# Patient Record
Sex: Male | Born: 1970 | Race: White | Hispanic: No | Marital: Married | Smoking: Former smoker
Health system: Southern US, Community
[De-identification: ages and names within clinical notes are randomized; demographics above are authoritative.]

## PROBLEM LIST (undated history)

## (undated) DIAGNOSIS — R634 Abnormal weight loss: Secondary | ICD-10-CM

## (undated) DIAGNOSIS — R1032 Left lower quadrant pain: Secondary | ICD-10-CM

## (undated) DIAGNOSIS — R011 Cardiac murmur, unspecified: Secondary | ICD-10-CM

## (undated) DIAGNOSIS — M5410 Radiculopathy, site unspecified: Secondary | ICD-10-CM

## (undated) DIAGNOSIS — F410 Panic disorder [episodic paroxysmal anxiety] without agoraphobia: Secondary | ICD-10-CM

## (undated) DIAGNOSIS — Z800 Family history of malignant neoplasm of digestive organs: Secondary | ICD-10-CM

## (undated) DIAGNOSIS — Z87438 Personal history of other diseases of male genital organs: Secondary | ICD-10-CM

## (undated) DIAGNOSIS — F32A Depression, unspecified: Secondary | ICD-10-CM

## (undated) DIAGNOSIS — F329 Major depressive disorder, single episode, unspecified: Secondary | ICD-10-CM

## (undated) DIAGNOSIS — G5620 Lesion of ulnar nerve, unspecified upper limb: Secondary | ICD-10-CM

## (undated) DIAGNOSIS — M720 Palmar fascial fibromatosis [Dupuytren]: Secondary | ICD-10-CM

## (undated) DIAGNOSIS — K219 Gastro-esophageal reflux disease without esophagitis: Secondary | ICD-10-CM

## (undated) DIAGNOSIS — J302 Other seasonal allergic rhinitis: Secondary | ICD-10-CM

## (undated) DIAGNOSIS — Z803 Family history of malignant neoplasm of breast: Secondary | ICD-10-CM

## (undated) DIAGNOSIS — J3489 Other specified disorders of nose and nasal sinuses: Secondary | ICD-10-CM

## (undated) HISTORY — DX: Family history of malignant neoplasm of breast: Z80.3

## (undated) HISTORY — DX: Depression, unspecified: F32.A

## (undated) HISTORY — DX: Radiculopathy, site unspecified: M54.10

## (undated) HISTORY — DX: Family history of malignant neoplasm of digestive organs: Z80.0

## (undated) HISTORY — DX: Left lower quadrant pain: R10.32

## (undated) HISTORY — DX: Lesion of ulnar nerve, unspecified upper limb: G56.20

## (undated) HISTORY — DX: Gastro-esophageal reflux disease without esophagitis: K21.9

## (undated) HISTORY — DX: Major depressive disorder, single episode, unspecified: F32.9

## (undated) HISTORY — DX: Personal history of other diseases of male genital organs: Z87.438

## (undated) HISTORY — DX: Other seasonal allergic rhinitis: J30.2

## (undated) HISTORY — DX: Cardiac murmur, unspecified: R01.1

## (undated) HISTORY — DX: Palmar fascial fibromatosis (dupuytren): M72.0

## (undated) HISTORY — PX: INGUINAL HERNIA REPAIR: SUR1180

## (undated) HISTORY — DX: Panic disorder (episodic paroxysmal anxiety): F41.0

## (undated) HISTORY — DX: Abnormal weight loss: R63.4

## (undated) HISTORY — DX: Other specified disorders of nose and nasal sinuses: J34.89

---

## 2009-11-24 ENCOUNTER — Ambulatory Visit: Payer: Self-pay | Admitting: Family Medicine

## 2009-11-24 DIAGNOSIS — K219 Gastro-esophageal reflux disease without esophagitis: Secondary | ICD-10-CM | POA: Insufficient documentation

## 2009-11-24 DIAGNOSIS — M79609 Pain in unspecified limb: Secondary | ICD-10-CM | POA: Insufficient documentation

## 2010-01-17 ENCOUNTER — Ambulatory Visit: Payer: Self-pay | Admitting: Family Medicine

## 2010-01-17 DIAGNOSIS — J309 Allergic rhinitis, unspecified: Secondary | ICD-10-CM

## 2010-03-13 DIAGNOSIS — R1032 Left lower quadrant pain: Secondary | ICD-10-CM

## 2010-03-13 HISTORY — DX: Left lower quadrant pain: R10.32

## 2010-04-12 NOTE — Assessment & Plan Note (Signed)
Summary: NEW TO EST//CCM   Vital Signs:  Patient profile:   40 year old male Weight:      187 pounds O2 Sat:      99 % Temp:     98.5 degrees F Pulse rate:   82 / minute BP sitting:   120 / 82  (left arm)  Vitals Entered By: Pura Spice, RN (November 24, 2009 2:47 PM) CC: to est discuss aches and pains  Is Patient Diabetic? No   History of Present Illness: 40 yr old male to establish with [place] to ask about symptoms in his left arm which have bothered him almost daily for about one year. He moved with his family to [place] about 2 months ago from the [place] for a work related assignment. He may or may not stay here long term. For the past year he has had sharp pains that begin in the left shoulder area that radiate down the left arm to the hand. These wax and wane. Sometimes he feels numbness or tingling down the arm as well. Often he wakes up at night with the arm either asleep or in pain. No trauma hx. No SOB or chest pains. No neck or back pains.   Preventive Screening-Counseling & Management  Alcohol-Tobacco     Smoking Status: quit  Allergies (verified): No Known Drug Allergies  Past History:  Past Medical History: chickenpox heart murmur GERD panic attacks in the past  Past Surgical History: left inguinal hernia repair  Family History: Reviewed history and no changes required. Family History Breast cancer 1st degree relative <50 Family History of Colon CA 1st degree relative <60  Social History: Reviewed history and no changes required. Married Former Smoker Alcohol use-yes Smoking Status:  quit  Review of Systems  The patient denies anorexia, fever, weight loss, weight gain, vision loss, decreased hearing, hoarseness, chest pain, syncope, dyspnea on exertion, peripheral edema, prolonged cough, headaches, hemoptysis, abdominal pain, melena, hematochezia, severe indigestion/heartburn, hematuria, incontinence, genital sores, muscle weakness,  suspicious skin lesions, transient blindness, difficulty walking, depression, unusual weight change, abnormal bleeding, enlarged lymph nodes, angioedema, breast masses, and testicular masses.    Physical Exam  General:  Well-developed,well-nourished,in no acute distress; alert,appropriate and cooperative throughout examination Neck:  No deformities, masses, or tenderness noted. Lungs:  Normal respiratory effort, chest expands symmetrically. Lungs are clear to auscultation, no crackles or wheezes. Heart:  Normal rate and regular rhythm. S1 and S2 normal without gallop, murmur, click, rub or other extra sounds. Msk:  No deformity or scoliosis noted of thoracic or lumbar spine.  He is tender in the upper left thoracic area between the spine and the scapula. No masses are felt.  Pulses:  R radial normal and L radial normal.   Extremities:  No clubbing, cyanosis, edema, or deformity noted with normal full range of motion of all joints.   Neurologic:  alert & oriented X3 and gait normal.   Cervical Nodes:  No lymphadenopathy noted Axillary Nodes:  No palpable lymphadenopathy   Impression & Recommendations:  Problem # 1:  ARM PAIN (ICD-729.5)  Orders: T-Cervical Spine Comp 4 Views (01601UX) T-Thoracic Spine 2 Views (32355DD)  Patient Instructions: 1)  this seems to be due to pinched nerves running to the arm, and the most likely source would be the spinal nerve roots. We will get Xrays of the cervical and thoracic spines today. He can use Motrin as needed .

## 2010-04-12 NOTE — Assessment & Plan Note (Signed)
Summary: sinus inf/cjr   Vital Signs:  Patient profile:   41 year old male Weight:      192 pounds O2 Sat:      99 % Temp:     97.5 degrees F Pulse rate:   82 / minute BP sitting:   112 / 76  (left arm)  Vitals Entered By: Pura Spice, RN (January 17, 2010 10:02 AM) CC: sinus inf    History of Present Illness: Here for recurrent symptoms of sinus congestion and nasal congestion which have plagued him fo several months. Has tried Afrin sprays and Advil Cold and Sinus with poor results. No fever or ST or cough.   Allergies (verified): No Known Drug Allergies  Past History:  Past Medical History: Reviewed history from 11/24/2009 and no changes required. chickenpox heart murmur GERD panic attacks in the past  Review of Systems  The patient denies anorexia, fever, weight loss, weight gain, vision loss, decreased hearing, hoarseness, chest pain, syncope, dyspnea on exertion, peripheral edema, prolonged cough, hemoptysis, abdominal pain, melena, hematochezia, severe indigestion/heartburn, hematuria, incontinence, genital sores, muscle weakness, suspicious skin lesions, transient blindness, difficulty walking, depression, unusual weight change, abnormal bleeding, enlarged lymph nodes, angioedema, breast masses, and testicular masses.    Physical Exam  General:  Well-developed,well-nourished,in no acute distress; alert,appropriate and cooperative throughout examination Head:  Normocephalic and atraumatic without obvious abnormalities. No apparent alopecia or balding. Eyes:  No corneal or conjunctival inflammation noted. EOMI. Perrla. Funduscopic exam benign, without hemorrhages, exudates or papilledema. Vision grossly normal. Ears:  External ear exam shows no significant lesions or deformities.  Otoscopic examination reveals clear canals, tympanic membranes are intact bilaterally without bulging, retraction, inflammation or discharge. Hearing is grossly normal bilaterally. Nose:   External nasal examination shows no deformity or inflammation. Nasal mucosa are pink and moist without lesions or exudates. Mouth:  Oral mucosa and oropharynx without lesions or exudates.  Teeth in good repair. Neck:  No deformities, masses, or tenderness noted. Lungs:  Normal respiratory effort, chest expands symmetrically. Lungs are clear to auscultation, no crackles or wheezes.   Impression & Recommendations:  Problem # 1:  ALLERGIC RHINITIS (ICD-477.9)  His updated medication list for this problem includes:    Flonase 50 Mcg/act Susp (Fluticasone propionate) .Marland Kitchen... 2 sprays in each nostril once daily  Orders: Depo- Medrol 80mg  (J1040) Depo- Medrol 40mg  (J1030) Admin of Therapeutic Inj  intramuscular or subcutaneous (04540)  Complete Medication List: 1)  Flonase 50 Mcg/act Susp (Fluticasone propionate) .... 2 sprays in each nostril once daily 2)  Claritin-d 24 Hour 10-240 Mg Xr24h-tab (Loratadine-pseudoephedrine) .... Once daily  Patient Instructions: 1)  Please schedule a follow-up appointment as needed .  Prescriptions: FLONASE 50 MCG/ACT SUSP (FLUTICASONE PROPIONATE) 2 sprays in each nostril once daily  #30 x 11   Entered and Authorized by:   Nelwyn Salisbury MD   Signed by:   Nelwyn Salisbury MD on 01/17/2010   Method used:   Electronically to        [facility] [address] (retail)       [address]       [place], [place]  [address]       Ph: [phone]       Fax: [phone]   RxID:   [id number]    Medication Administration  Injection # 1:    Medication: Depo- Medrol 80mg     Diagnosis: ALLERGIC RHINITIS (ICD-477.9)  Route: IM    Site: RUOQ gluteus    Exp Date: 08/2012    Lot #: [id number]    Mfr: Pharmacia    Patient tolerated injection without complications    Given by: Pura Spice, RN (January 17, 2010 10:23 AM)  Injection # 2:    Medication: Depo- Medrol 40mg     Diagnosis: ALLERGIC RHINITIS (ICD-477.9)    Route:  IM    Site: RUOQ gluteus    Exp Date: 08/2012    Lot #: [id number]    Mfr: Pharmacia    Patient tolerated injection without complications    Given by: Pura Spice, RN (January 17, 2010 10:24 AM)  Orders Added: 1)  Est. Patient Level IV [20254] 2)  Depo- Medrol 80mg  [J1040] 3)  Depo- Medrol 40mg  [J1030] 4)  Admin of Therapeutic Inj  intramuscular or subcutaneous [27062]

## 2010-08-23 ENCOUNTER — Encounter: Payer: Self-pay | Admitting: Family Medicine

## 2010-08-24 ENCOUNTER — Encounter: Payer: Self-pay | Admitting: Internal Medicine

## 2010-08-24 ENCOUNTER — Ambulatory Visit (INDEPENDENT_AMBULATORY_CARE_PROVIDER_SITE_OTHER): Payer: BC Managed Care – PPO | Admitting: Internal Medicine

## 2010-08-24 VITALS — BP 100/60 | HR 89 | Wt 192.0 lb

## 2010-08-24 DIAGNOSIS — L989 Disorder of the skin and subcutaneous tissue, unspecified: Secondary | ICD-10-CM

## 2010-08-24 DIAGNOSIS — Z800 Family history of malignant neoplasm of digestive organs: Secondary | ICD-10-CM | POA: Insufficient documentation

## 2010-08-24 NOTE — Progress Notes (Signed)
  Subjective:    Patient ID: Anthony Gilmore, male    DOB: 1970/06/12, 40 y.o.   MRN: [id number]  HPI Pt presents to clinic for evaluation of nonhealing skin lesion. Notes at least four-month history of right upper arm skin lesion/mole before the scab and not fully healed. The area remains irritated. There's been no drainage or surrounding erythema. Does not appear to be secondarily infected. Also has questions regarding malignancy screening. Notes mother diagnosed with colon cancer at age 40. Patient has not had colonoscopy and denies inappropriate weight loss, change in bowel habits or blood in stool. No other complaints.  Reviewed past medical history, medications and allergies.  Review of Systems see history of present illness     Objective:   Physical Exam  Nursing note and vitals reviewed. Constitutional: He appears well-developed and well-nourished. No distress.  HENT:  Head: Normocephalic and atraumatic.  Right Ear: External ear normal.  Left Ear: External ear normal.  Eyes: Conjunctivae are normal. No scleral icterus.  Neurological: He is alert.  Skin: Skin is warm and dry. He is not diaphoretic.       Right upper arm reveals a small approximately 4 mm skin lesion with overlying scab. No surrounding erythema or drainage. Nontender.  Psychiatric: He has a normal mood and affect.          Assessment & Plan:

## 2010-08-24 NOTE — Assessment & Plan Note (Signed)
Nonhealing times at least four months. Dermatology consult.

## 2010-08-24 NOTE — Assessment & Plan Note (Signed)
Asymptomatic. Reviewed in detail colonoscopy screening guidelines. Given first degree relative with diagnosis at age 39 with prompt colonoscopy at age 47. Encouraged patient to followup with PMD as well for physicals

## 2010-09-13 ENCOUNTER — Encounter: Payer: Self-pay | Admitting: Family Medicine

## 2010-09-13 ENCOUNTER — Ambulatory Visit (INDEPENDENT_AMBULATORY_CARE_PROVIDER_SITE_OTHER): Payer: BC Managed Care – PPO | Admitting: Family Medicine

## 2010-09-13 VITALS — BP 110/80 | HR 76 | Temp 98.5°F | Wt 188.0 lb

## 2010-09-13 DIAGNOSIS — N419 Inflammatory disease of prostate, unspecified: Secondary | ICD-10-CM

## 2010-09-13 MED ORDER — OMEPRAZOLE 20 MG PO TBEC: 20.0000 mg | DELAYED_RELEASE_TABLET | Freq: Every day | ORAL | Status: DC

## 2010-09-13 MED ORDER — DOXYCYCLINE HYCLATE 100 MG PO CAPS: 100.0000 mg | ORAL_CAPSULE | Freq: Two times a day (BID) | ORAL | Status: AC

## 2010-09-13 NOTE — Progress Notes (Signed)
  Subjective:    Patient ID: Anthony Gilmore, male    DOB: [birth year], 40 y.o.   MRN: [id number]  HPI Here for 2 months of lower pelvic discomfort, urge to urinate, and sharp pains during intercourse. These pains are worse just before ejaculating, but ejaculating itself is not painful. No DC seen. No fevers.    Review of Systems  Constitutional: Negative.   Gastrointestinal: Negative.   Genitourinary: Positive for urgency and frequency. Negative for dysuria, hematuria, discharge, difficulty urinating and testicular pain.       Objective:   Physical Exam  Constitutional: He appears well-developed and well-nourished.  Abdominal: Soft. Bowel sounds are normal. He exhibits no distension and no mass. There is no tenderness. There is no rebound and no guarding.       No hernias felt   Genitourinary:       Prostate is not swollen but is mildly tender           Assessment & Plan:  Treat with Doxycycline for 10 days . Recheck prn

## 2010-10-07 ENCOUNTER — Telehealth: Payer: Self-pay | Admitting: Family Medicine

## 2010-10-07 DIAGNOSIS — N411 Chronic prostatitis: Secondary | ICD-10-CM

## 2010-10-07 NOTE — Telephone Encounter (Signed)
Was put on abx and dr Clent Ridges told him if his sxs did not cleared up, he was going to refer him to an Insurance underwriter. Thanks.

## 2010-10-07 NOTE — Telephone Encounter (Signed)
Called to make pt aware that referral had been sent in for urology and someone should be contacting pt within the next 5 to 7 business days.

## 2010-10-07 NOTE — Telephone Encounter (Signed)
I did order a referral

## 2011-01-06 ENCOUNTER — Ambulatory Visit (INDEPENDENT_AMBULATORY_CARE_PROVIDER_SITE_OTHER): Payer: Managed Care, Other (non HMO) | Admitting: Family Medicine

## 2011-01-06 ENCOUNTER — Encounter: Payer: Self-pay | Admitting: Family Medicine

## 2011-01-06 VITALS — BP 118/76 | HR 103 | Temp 98.6°F | Wt 186.0 lb

## 2011-01-06 DIAGNOSIS — F32A Depression, unspecified: Secondary | ICD-10-CM

## 2011-01-06 DIAGNOSIS — F329 Major depressive disorder, single episode, unspecified: Secondary | ICD-10-CM

## 2011-01-06 DIAGNOSIS — J309 Allergic rhinitis, unspecified: Secondary | ICD-10-CM

## 2011-01-06 DIAGNOSIS — F3289 Other specified depressive episodes: Secondary | ICD-10-CM

## 2011-01-06 DIAGNOSIS — Z9109 Other allergy status, other than to drugs and biological substances: Secondary | ICD-10-CM

## 2011-01-06 MED ORDER — METHYLPREDNISOLONE ACETATE 80 MG/ML IJ SUSP
120.0000 mg | Freq: Once | INTRAMUSCULAR | Status: AC
  Administered 2011-01-06: 120 mg via INTRAMUSCULAR

## 2011-01-06 MED ORDER — SERTRALINE HCL 50 MG PO TABS: 50.0000 mg | ORAL_TABLET | Freq: Every day | ORAL | Status: DC

## 2011-01-06 NOTE — Progress Notes (Signed)
Addended by: Aniceto Boss A on: 01/06/2011 12:04 PM   Modules accepted: Orders

## 2011-01-06 NOTE — Progress Notes (Signed)
  Subjective:    Patient ID: Anthony Gilmore, male    DOB: 1970-12-12, 40 y.o.   MRN: [id number]  HPI Here for 2 reasons. First his fall allergies are acting up with itchy eyes, runny nose, etc. He is taking antihistamines. Second he thinks his old depression is flaring up again. When he was in his 45s living in the [place] he dealt with depression. He took an SSRI and it helped. Now for 3 months he has had a lack of motivation, some sadness, and some short tempers.    Review of Systems  Constitutional: Negative.   HENT: Positive for congestion, rhinorrhea, sneezing and postnasal drip.   Eyes: Positive for itching.  Respiratory: Negative.   Psychiatric/Behavioral: Positive for dysphoric mood and decreased concentration.       Objective:   Physical Exam  Constitutional: He appears well-developed and well-nourished.  HENT:  Right Ear: External ear normal.  Left Ear: External ear normal.  Nose: Nose normal.  Mouth/Throat: Oropharynx is clear and moist. No oropharyngeal exudate.  Eyes: Conjunctivae are normal. Pupils are equal, round, and reactive to light.  Neck: No thyromegaly present.  Pulmonary/Chest: Effort normal and breath sounds normal.  Lymphadenopathy:    He has no cervical adenopathy.  Psychiatric: He has a normal mood and affect. His behavior is normal. Thought content normal.          Assessment & Plan:  Start on Zoloft and recheck in 2 week s

## 2011-03-14 DIAGNOSIS — M5410 Radiculopathy, site unspecified: Secondary | ICD-10-CM

## 2011-03-14 HISTORY — DX: Radiculopathy, site unspecified: M54.10

## 2011-06-23 ENCOUNTER — Encounter: Payer: Self-pay | Admitting: Family Medicine

## 2011-06-23 ENCOUNTER — Ambulatory Visit (INDEPENDENT_AMBULATORY_CARE_PROVIDER_SITE_OTHER): Payer: Managed Care, Other (non HMO) | Admitting: Family Medicine

## 2011-06-23 ENCOUNTER — Telehealth: Payer: Self-pay | Admitting: Family Medicine

## 2011-06-23 VITALS — BP 110/75 | HR 67 | Temp 98.0°F | Ht 71.0 in | Wt 181.0 lb

## 2011-06-23 DIAGNOSIS — M5412 Radiculopathy, cervical region: Secondary | ICD-10-CM

## 2011-06-23 MED ORDER — GABAPENTIN 300 MG PO CAPS: 300.0000 mg | ORAL_CAPSULE | Freq: Three times a day (TID) | ORAL | Status: DC

## 2011-06-23 NOTE — Progress Notes (Signed)
Office Note 06/28/2011  CC:  Chief Complaint  Patient presents with  . Establish Care    ongoing shoulder pain, has been evaluated by Dr. Clent Ridges in the past    HPI:  Anthony Gilmore is a 41 y.o. White male who is transferring care here from [facility] (Dr. Abran Cantor) due to convenience to his home. Patient's most recent primary MD: Dr. Abran Cantor Old records in EPIC/HL EMR were reviewed prior to or during today's visit.  Patient c/o 3-4 yr history of left shoulder and arm pain, described as sharp/shooting pain, with intermittent feeling of tingling/numbness from shoulder down to hand as well.  Lately some of the pain has extended into left sided of neck and left pectoral muscle areas.  Only recently has neck movement begun to worsen his arm pains.  Denies weakness.  He describes waking up nightly with "dead arm" which lasts about 5 min until he moves around enough to get feeling back.   In the past, C-spine and T spine x-rays were done and were normal.  PT was suggested but he says he was too busy at the time. No hx of neck or shoulder or arm trauma other than a skateboarding accident at age 80 in which he landed on left shoulder, but no fracture or dislocation occurred per pt.    Past Medical History  Diagnosis Date  . Heart murmur   . GERD (gastroesophageal reflux disease)   . Panic attacks     in the past  . Depression   . History of prostatitis   . Seasonal allergic rhinitis   . Family history of colon cancer   . Family history of breast cancer     Past Surgical History  Procedure Date  . Inguinal hernia repair     left    Family History  Problem Relation Age of Onset  . Breast cancer    . Colon cancer    . Cancer Mother     Breast, colon, lung  . Cancer Sister 65    breast    History   Social History  . Marital Status: Married    Spouse Name: N/A    Number of Children: N/A  . Years of Education: N/A   Occupational History  . Not on file.   Social History  Main Topics  . Smoking status: Current Some Day Smoker    Types: Cigarettes  . 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 Alcohol Use: 2.5 oz/week    5 drink(s) per week  . Drug Use: No  . Sexually Active: Not on file   Other Topics Concern  . Not on file   Social History Narrative   Married, 2 children (ages 52-boy, age 40 girl).Relocated from [place] to 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  Alcohol: couple beers several days a week.No drug use/abuse.    Outpatient Encounter Prescriptions as of 06/23/2011  Medication Sig Dispense Refill  . Multiple Vitamin (MULTIVITAMIN) tablet Take 1 tablet by mouth daily.      . Omega-3 Fatty Acids (FISH OIL) 1000 MG CAPS Take 3 capsules by mouth daily.      . fluticasone (FLONASE) 50 MCG/ACT nasal spray Place 2 sprays into the nose daily.        Marland Kitchen gabapentin (NEURONTIN) 300 MG capsule Take 1 capsule (300 mg  total) by mouth 3 (three) times daily.  30 capsule  1  . loratadine-pseudoephedrine (CLARITIN-D 24-HOUR) 10-240 MG per 24 hr tablet Take 1 tablet by mouth daily.        Marland Kitchen DISCONTD: Omeprazole 20 MG TBEC Take 1 tablet (20 mg total) by mouth daily.  1 each  0  . DISCONTD: sertraline (ZOLOFT) 50 MG tablet Take 1 tablet (50 mg total) by mouth daily.  30 tablet  2    No Known Allergies  ROS Review of Systems  Constitutional: Negative for fever and fatigue.  HENT: Negative for hearing loss, congestion and sore throat.   Eyes: Negative for visual disturbance.  Respiratory: Negative for cough.   Cardiovascular: Negative for chest pain.  Gastrointestinal: Negative for nausea and abdominal pain.  Genitourinary: Negative for dysuria.  Musculoskeletal: Negative for back pain and joint swelling.  Skin: Negative for rash.  Neurological: Negative for weakness and  headaches.  Hematological: Negative for adenopathy.    PE; Blood pressure 110/75, pulse 67, temperature 98 F (36.7 C), temperature source Temporal, height 5\' 11"  (1.803 m), weight 181 lb (82.101 kg), SpO2 99.00%. Gen: Alert, well appearing.  Patient is oriented to person, place, time, and situation. ENT: EOMI, PERRLA. Nose: no drainage or turbinate edema/swelling.  No injection or focal lesion.  Mouth: lips without lesion/swelling.  Oral mucosa pink and moist.  Dentition intact and without obvious caries or gingival swelling.  Oropharynx without erythema, exudate, or swelling.  NECK: mild left sided cervical spine soft tissue TTP.  Mild cervical mm and trap mm pain on left with turning head to left and with spurling's test.  UE strength and sensation intact.  Extremity color is normal.  Triceps, biceps, brachioradialis reflexes all 1+ bilat.  Left shoulder is nontender to palpation.  ROM of left shoulder is intact without pain or stiffness.  UE pulses 2+ and symmetric.  Upper back shows no muscle wasting.  CV: RRR, no m/r/g.   LUNGS: CTA bilat, nonlabored resps, good aeration in all lung fields.   Pertinent labs:  none  ASSESSMENT AND PLAN:   Radiculopathy of cervical region Unclear etiology: doesn't fit well with cervical nerve root compression (multiple levels seem involved, plus plain films of spine showed no abnormality).  Question reflex sympathetic dystrophy. Needs eval by neurologist in my opinion.  PT may help but will hold off on this until neuro eval.  Will image left shoulder for completeness. Will try neurontin trial (300mg  tid), recheck in office in 6 wks.      Return in about 1 month (around 07/23/2011) for f/u radiculopathy pain meds.

## 2011-06-23 NOTE — Telephone Encounter (Signed)
Pharmacy changed

## 2011-06-28 NOTE — Assessment & Plan Note (Addendum)
Unclear etiology: doesn't fit well with cervical nerve root compression (multiple levels seem involved, plus plain films of spine showed no abnormality).  Question reflex sympathetic dystrophy. Needs eval by neurologist in my opinion.  PT may help but will hold off on this until neuro eval.  Will image left shoulder for completeness. Will try neurontin trial (300mg  tid), recheck in office in 6 wks.

## 2011-06-30 ENCOUNTER — Ambulatory Visit (HOSPITAL_BASED_OUTPATIENT_CLINIC_OR_DEPARTMENT_OTHER)
Admission: RE | Admit: 2011-06-30 | Discharge: 2011-06-30 | Disposition: A | Discharge status: Home / Self Care | Payer: Managed Care, Other (non HMO) | Source: Ambulatory Visit | Attending: Family Medicine | Admitting: Family Medicine

## 2011-06-30 DIAGNOSIS — M5412 Radiculopathy, cervical region: Secondary | ICD-10-CM

## 2011-06-30 DIAGNOSIS — M25519 Pain in unspecified shoulder: Secondary | ICD-10-CM

## 2011-07-06 ENCOUNTER — Ambulatory Visit (INDEPENDENT_AMBULATORY_CARE_PROVIDER_SITE_OTHER): Payer: Managed Care, Other (non HMO) | Admitting: Neurology

## 2011-07-06 ENCOUNTER — Encounter: Payer: Self-pay | Admitting: Neurology

## 2011-07-06 VITALS — BP 108/72 | HR 60 | Wt 181.0 lb

## 2011-07-06 DIAGNOSIS — R202 Paresthesia of skin: Secondary | ICD-10-CM

## 2011-07-06 DIAGNOSIS — R200 Anesthesia of skin: Secondary | ICD-10-CM

## 2011-07-06 DIAGNOSIS — R209 Unspecified disturbances of skin sensation: Secondary | ICD-10-CM

## 2011-07-06 NOTE — Patient Instructions (Signed)
Your MRI is scheduled for Saturday, April 27th at 8:15 at [facility] [address] Please arrive by 7:45am [phone].  We will call you with your appointment for the nerve conduction studies/electromyelogram.  They will be done at  [facility] [address] [place], [place] [phone].

## 2011-07-06 NOTE — Progress Notes (Signed)
Dear Dr. Milinda Cave,  Thank you for having me see Anthony Gilmore in consultation today at [facility] for his problem with left shoulder and arm pain.  As you may recall, he is a 41 y.o. year old male with a benign medical history who presents with a greater than year history of burning, shooting pain in his shoulder that has progressed to numbness in his arm.  He has been started on gabapentin 300mg  daily, and this helps with his pain.  He does not have a history of neck pain, although has felt that his neck pain is getting worse recently over the last two weeks.  He denies any precipitating trauma.  He has had a xray of his neck that was unremarkable.  Past Medical History  Diagnosis Date  . Heart murmur   . GERD (gastroesophageal reflux disease)   . Panic attacks     in the past  . Depression   . History of prostatitis   . Seasonal allergic rhinitis   . Family history of colon cancer   . Family history of breast cancer     Past Surgical History  Procedure Date  . Inguinal hernia repair     left    History   Social History  . Marital Status: Married    Spouse Name: N/A    Number of Children: N/A  . Years of Education: N/A   Social History Main Topics  . Smoking status: Current Some Day Smoker    Types: Cigarettes  . 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 Alcohol Use: 2.5 oz/week    5 drink(s) per week  . Drug Use: No  . Sexually Active: None   Other Topics Concern  . None   Social History Narrative   Married, 2 children (ages 73-boy, age 27 girl).Relocated from [place] to 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  Alcohol: couple beers several days a week.No drug use/abuse.    Family History  Problem Relation Age of Onset  . Breast cancer    . Colon cancer    . Cancer Mother     Breast, colon, lung  . Cancer Sister 12    breast  -  Current Outpatient Prescriptions on File Prior to Visit  Medication Sig Dispense Refill  . gabapentin (NEURONTIN) 300 MG capsule Take 1 capsule (300 mg total) by mouth 3 (three) times daily.  30 capsule  1  . Multiple Vitamin (MULTIVITAMIN) tablet Take 1 tablet by mouth daily.      . Omega-3 Fatty Acids (FISH OIL) 1000 MG CAPS Take 3 capsules by mouth daily.      . fluticasone (FLONASE) 50 MCG/ACT nasal spray Place 2 sprays into the nose daily.        Marland Kitchen loratadine-pseudoephedrine (CLARITIN-D 24-HOUR) 10-240 MG per 24 hr tablet Take 1 tablet by mouth daily.          No Known Allergies    ROS:  13 systems were reviewed and are notable for no numbness in his perineal area.  All other review of systems are unremarkable.   Examination:  Filed Vitals:   07/06/11 1013  BP: 108/72  Pulse: 60  Weight: 181 lb (82.101 kg)     In general, well appearing man.  Cardiovascular: The patient has a regular rate and rhythm and no carotid  bruits.  Fundoscopy:  Disks are flat. Vessel caliber within normal limits.  Mental status:   The patient is oriented to person, place and time. Recent and remote memory are intact. Attention span and concentration are normal. Language including repetition, naming, following commands are intact. Fund of knowledge of current and historical events, as well as vocabulary are normal.  Cranial Nerves: Pupils are equally round and reactive to light. Visual fields full to confrontation. Extraocular movements are intact without nystagmus. Facial sensation and muscles of mastication are intact. Muscles of facial expression are symmetric. Hearing intact to bilateral finger rub. Tongue protrusion, uvula, palate midline.  Shoulder shrug intact  Motor:  The patient has normal bulk and tone, no pronator drift.  There are no adventitious movements.  5/5 muscle strength bilaterally.  Reflexes:    Biceps  Triceps Brachioradialis Knee Ankle  Right 2+  2+  2+   2+ 2+  Left  2+  2+  2+   2+ 2+  Toes down  Coordination:  Normal finger to nose.  No dysdiadokinesia.  Sensation is decreased to temperature in his feet in a length dependent manner.  No sensory abnormalities in his left arm.    Gait and Station are normal.  Tandem gait is intact.  Romberg is negative  Provoking maneuvers:  + spurling's bilaterally(although this induces numbness in his left arm).  - Tinel's at elbow and wrist bilaterally.   Impression/Recs 1.  Left shoulder arm pain - likely radiculopathy.  Will get an EMG/NCS of LUE as well as an MRI c-spine 2.  ?Peripheral neuropathy - Will also check with a NCS whether he has any signs of peripheral neuropathy in his lower extremities.   We will see the patient back in 2 months.  Thank you for having [place] see Anthony Gilmore in consultation.  Feel free to contact me with any questions.  Lupita Raider Modesto Charon, MD [facility], [place] [address] [place], [place] [address] Phone: [phone] Fax: [phone].

## 2011-07-08 ENCOUNTER — Ambulatory Visit
Admission: RE | Admit: 2011-07-08 | Discharge: 2011-07-08 | Disposition: A | Discharge status: Home / Self Care | Payer: Managed Care, Other (non HMO) | Source: Ambulatory Visit | Attending: Neurology | Admitting: Neurology

## 2011-07-08 DIAGNOSIS — R200 Anesthesia of skin: Secondary | ICD-10-CM

## 2011-07-17 ENCOUNTER — Telehealth: Payer: Self-pay

## 2011-07-17 NOTE — Telephone Encounter (Signed)
Pt notified of MRI results

## 2011-07-21 ENCOUNTER — Ambulatory Visit: Payer: Managed Care, Other (non HMO) | Admitting: Family Medicine

## 2011-09-12 ENCOUNTER — Ambulatory Visit: Payer: Managed Care, Other (non HMO) | Admitting: Neurology

## 2011-09-26 ENCOUNTER — Encounter: Payer: Self-pay | Admitting: Family Medicine

## 2011-09-26 ENCOUNTER — Ambulatory Visit (INDEPENDENT_AMBULATORY_CARE_PROVIDER_SITE_OTHER): Payer: Managed Care, Other (non HMO) | Admitting: Family Medicine

## 2011-09-26 VITALS — BP 112/74 | HR 81 | Ht 71.0 in | Wt 183.0 lb

## 2011-09-26 DIAGNOSIS — G629 Polyneuropathy, unspecified: Secondary | ICD-10-CM

## 2011-09-26 DIAGNOSIS — M5412 Radiculopathy, cervical region: Secondary | ICD-10-CM

## 2011-09-26 DIAGNOSIS — G609 Hereditary and idiopathic neuropathy, unspecified: Secondary | ICD-10-CM

## 2011-09-26 DIAGNOSIS — A6920 Lyme disease, unspecified: Secondary | ICD-10-CM | POA: Insufficient documentation

## 2011-09-26 LAB — CBC WITH DIFFERENTIAL/PLATELET
Basophils Absolute: 0 K/uL (ref 0.0–0.1)
Basophils Relative: 0.6 % (ref 0.0–3.0)
Eosinophils Absolute: 0.1 K/uL (ref 0.0–0.7)
Eosinophils Relative: 2.1 % (ref 0.0–5.0)
HCT: 43 % (ref 39.0–52.0)
Hemoglobin: 14.4 g/dL (ref 13.0–17.0)
Lymphocytes Relative: 31.4 % (ref 12.0–46.0)
Lymphs Abs: 0.9 K/uL (ref 0.7–4.0)
MCHC: 33.6 g/dL (ref 30.0–36.0)
MCV: 94.5 fl (ref 78.0–100.0)
Monocytes Absolute: 0.3 K/uL (ref 0.1–1.0)
Monocytes Relative: 8.8 % (ref 3.0–12.0)
Neutro Abs: 1.7 K/uL (ref 1.4–7.7)
Neutrophils Relative %: 57.1 % (ref 43.0–77.0)
Platelets: 132 K/uL — ABNORMAL LOW (ref 150.0–400.0)
RBC: 4.55 Mil/uL (ref 4.22–5.81)
RDW: 12.9 % (ref 11.5–14.6)
WBC: 3 K/uL — ABNORMAL LOW (ref 4.5–10.5)

## 2011-09-26 LAB — COMPREHENSIVE METABOLIC PANEL
AST: 22 U/L (ref 0–37)
Alkaline Phosphatase: 45 U/L (ref 39–117)
BUN: 19 mg/dL (ref 6–23)
Creatinine, Ser: 1 mg/dL (ref 0.4–1.5)
Total Bilirubin: 1.1 mg/dL (ref 0.3–1.2)

## 2011-09-26 LAB — SEDIMENTATION RATE: Sed Rate: 5 mm/h (ref 0–22)

## 2011-09-26 NOTE — Progress Notes (Signed)
OFFICE NOTE  09/26/2011  CC:  Chief Complaint  Patient presents with  . Shoulder Pain    ongoing left shoulder pain; had tick bite on arm at same time as pain began 20 months ago     HPI: Patient is a 41 y.o. Caucasian male who is here for f/u left shoulder pain. He saw Dr. Modesto Charon, neurologist, for this and his impression was that he had a cervical radiculopathy and he ordered a C-spine MRI and NCS/EMGs.  The MRI did not show any significant abormality to explain his pain.  NCS/EMGs have not been done yet.  He has decided to not pursue these tests and "just live with it".  Tried neurontin and this helped but it caused too much sedation.  He says he just wants to "live with it".   He then was reading an article on lyme disease and saw a pic of the rash and he says he had a rash just like erythema migrans about 20 mo/ago on volar surface of left forearm.  ? Of seeing a tick in the area prior. General malaise, URI, fevers? Followed for unclear duration and this eventually cleared gradually over 1-69mo.  He wonders if the left shoulder radiculopathy pain has anything to do with this.  Also, left ankle with morning stiffness and pain most days recently, without obvious swelling or redness.  Also feels some intermittent feet numbness recently.  Pertinent PMH:  Past Medical History  Diagnosis Date  . Heart murmur   . GERD (gastroesophageal reflux disease)   . Panic attacks     in the past  . Depression   . History of prostatitis   . Seasonal allergic rhinitis   . Family history of colon cancer   . Family history of breast cancer   . Left groin pain 2012    [facility] eval was reassuring; pt chose no further w/u (with surgeon or neurologist); ibuprofen was recommended.  . Radiculopathy of arm 2013    Neuro (Dr. Modesto Charon) eval; MRI C-spine did not explain the pain, awaiting NCS/EMGs as of 09/26/11.    MEDS:  Outpatient Prescriptions Prior to Visit  Medication Sig Dispense Refill  . Multiple  Vitamin (MULTIVITAMIN) tablet Take 1 tablet by mouth daily.      . Omega-3 Fatty Acids (FISH OIL) 1000 MG CAPS Take 3 capsules by mouth daily.      . fluticasone (FLONASE) 50 MCG/ACT nasal spray Place 2 sprays into the nose daily.        Marland Kitchen loratadine-pseudoephedrine (CLARITIN-D 24-HOUR) 10-240 MG per 24 hr tablet Take 1 tablet by mouth daily.        Marland Kitchen gabapentin (NEURONTIN) 300 MG capsule Take 1 capsule (300 mg total) by mouth 3 (three) times daily.  30 capsule  1    PE: Blood pressure 112/74, pulse 81, height 5\' 11"  (1.803 m), weight 183 lb (83.008 kg). Gen: Alert, well appearing.  Patient is oriented to person, place, time, and situation. NECK: ROM full, nontender. Spurling's: on left this brings mild left sided neck discomfort without radiation. Neg on right. ROM of shoulders intact without pain.  No shoulder tenderness.  Strength 5/5 prox and dist in both UE's. Sensation: fine touch and prick with tip of paperclip are normal on both arms and hands. Ankles: left ankle with no erythema, swelling, warmth, or tenderness.  ROM full intact without stiffness.  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 Also question of mild arthritis/arthralgia in left ankle of unclear significance. Will check Lyme dz IgM and IgG titers, ESR, CBC, CMET. As far as treatment of the pain,  I let him know that other neuropathic meds are available to try but he declined these at this time.  He still is not interested in pursuing the NCS/EMG that Dr. Modesto Charon had ordered.   FOLLOW UP: prn

## 2011-09-26 NOTE — Assessment & Plan Note (Addendum)
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 Also question of mild arthritis/arthralgia in left ankle of unclear significance. Will check Lyme dz IgM and IgG titers, ESR, CBC, CMET. As far as treatment of the pain,  I let him know that other neuropathic meds are available to try but he declined these at this time.

## 2011-09-27 ENCOUNTER — Other Ambulatory Visit: Payer: Self-pay | Admitting: Family Medicine

## 2011-09-27 DIAGNOSIS — D696 Thrombocytopenia, unspecified: Secondary | ICD-10-CM

## 2011-09-27 DIAGNOSIS — D72819 Decreased white blood cell count, unspecified: Secondary | ICD-10-CM

## 2011-10-27 ENCOUNTER — Other Ambulatory Visit: Payer: Managed Care, Other (non HMO)

## 2011-10-30 ENCOUNTER — Other Ambulatory Visit (INDEPENDENT_AMBULATORY_CARE_PROVIDER_SITE_OTHER): Payer: Managed Care, Other (non HMO)

## 2011-10-30 DIAGNOSIS — D72819 Decreased white blood cell count, unspecified: Secondary | ICD-10-CM

## 2011-10-30 DIAGNOSIS — D696 Thrombocytopenia, unspecified: Secondary | ICD-10-CM

## 2011-10-30 LAB — CBC WITH DIFFERENTIAL/PLATELET
Basophils Relative: 0.7 % (ref 0.0–3.0)
Eosinophils Absolute: 0.2 10*3/uL (ref 0.0–0.7)
Hemoglobin: 14 g/dL (ref 13.0–17.0)
MCHC: 33.2 g/dL (ref 30.0–36.0)
MCV: 94.4 fl (ref 78.0–100.0)
Monocytes Absolute: 0.3 10*3/uL (ref 0.1–1.0)
Neutro Abs: 1.5 10*3/uL (ref 1.4–7.7)
RBC: 4.48 Mil/uL (ref 4.22–5.81)

## 2012-02-02 ENCOUNTER — Ambulatory Visit: Payer: Managed Care, Other (non HMO) | Admitting: Family Medicine

## 2012-02-06 ENCOUNTER — Encounter: Payer: Self-pay | Admitting: Family Medicine

## 2012-02-06 ENCOUNTER — Ambulatory Visit (INDEPENDENT_AMBULATORY_CARE_PROVIDER_SITE_OTHER): Payer: Managed Care, Other (non HMO) | Admitting: Family Medicine

## 2012-02-06 VITALS — BP 118/72 | HR 59 | Temp 97.9°F | Ht 71.0 in | Wt 188.0 lb

## 2012-02-06 DIAGNOSIS — D696 Thrombocytopenia, unspecified: Secondary | ICD-10-CM

## 2012-02-06 DIAGNOSIS — D72819 Decreased white blood cell count, unspecified: Secondary | ICD-10-CM

## 2012-02-06 DIAGNOSIS — Z23 Encounter for immunization: Secondary | ICD-10-CM

## 2012-02-06 LAB — CBC WITH DIFFERENTIAL/PLATELET
Basophils Absolute: 0 10*3/uL (ref 0.0–0.1)
Eosinophils Absolute: 0.1 10*3/uL (ref 0.0–0.7)
Hemoglobin: 15 g/dL (ref 13.0–17.0)
Lymphocytes Relative: 33.8 % (ref 12.0–46.0)
MCHC: 33.9 g/dL (ref 30.0–36.0)
MCV: 93.2 fl (ref 78.0–100.0)
Monocytes Absolute: 0.3 10*3/uL (ref 0.1–1.0)
Neutro Abs: 2.1 10*3/uL (ref 1.4–7.7)
Neutrophils Relative %: 54.6 % (ref 43.0–77.0)
RDW: 12.4 % (ref 11.5–14.6)

## 2012-02-06 NOTE — Progress Notes (Signed)
OFFICE NOTE  02/06/2012  CC:  Chief Complaint  Patient presents with  . Follow-up    labs, polyneuropathy     HPI: Patient is a 41 y.o. Caucasian male who is here for 4 mo f/u mild leukopenia and mild thrombocytopenia seen on labs done in July and August of this year.  Denies fatigue, malaise, SOB, bleeding, unexplained wt loss, or fevers.  He still has his unexplained left shoulder/axilla pains in random spots and his chronic left arm/hand paresthesias.  He is still of a mind to simply live with this problem and do no further w/u for it (she shoulder and paresthesias, that is).  Pertinent PMH:  Past Medical History  Diagnosis Date  . Heart murmur   . GERD (gastroesophageal reflux disease)   . Panic attacks     in the past  . Depression   . History of prostatitis   . Seasonal allergic rhinitis   . Family history of colon cancer   . Family history of breast cancer   . Left groin pain 2012    [facility] eval was reassuring; pt chose no further w/u (with surgeon or neurologist); ibuprofen was recommended.  . Radiculopathy of arm 2013    Neuro (Dr. Modesto Charon) eval; MRI C-spine did not explain the pain, awaiting NCS/EMGs as of 09/26/11.    MEDS:  Outpatient Prescriptions Prior to Visit  Medication Sig Dispense Refill  . Multiple Vitamin (MULTIVITAMIN) tablet Take 1 tablet by mouth daily.      . Omega-3 Fatty Acids (FISH OIL) 1000 MG CAPS Take 3 capsules by mouth daily.      . fluticasone (FLONASE) 50 MCG/ACT nasal spray Place 2 sprays into the nose daily.        Marland Kitchen loratadine-pseudoephedrine (CLARITIN-D 24-HOUR) 10-240 MG per 24 hr tablet Take 1 tablet by mouth daily.         Last reviewed on 02/06/2012 10:50 AM by Jeoffrey Massed, MD  PE: Blood pressure 118/72, pulse 59, temperature 97.9 F (36.6 C), temperature source Temporal, height 5\' 11"  (1.803 m), weight 188 lb (85.276 kg). Gen: Alert, well appearing.  Patient is oriented to person, place, time, and situation. SKIN: no  pallor or petechiae CV: RRR, no m/r/g.   LUNGS: CTA bilat, nonlabored resps, good aeration in all lung fields. ABD: soft, NT, ND, BS normal.  No hepatospenomegaly or mass.  No bruits.   IMPRESSION AND PLAN:  Thrombocytopenia Mild and asymptomatic, with also mildly low WBC counts. Will repeat today, and if these abnormalities are still present then will do abd u/s and get path smear of blood and refer to hematologist for further e/m.   An After Visit Summary was printed and given to the patient.  FOLLOW UP: after 41th birthday for CPE--at which time we'll refer to GI due to his mom's hx of colon cancer.

## 2012-02-06 NOTE — Assessment & Plan Note (Signed)
Mild and asymptomatic, with also mildly low WBC counts. Will repeat today, and if these abnormalities are still present then will do abd u/s and get path smear of blood and refer to hematologist for further e/m.

## 2012-02-07 ENCOUNTER — Other Ambulatory Visit: Payer: Self-pay | Admitting: Family Medicine

## 2012-02-07 DIAGNOSIS — D696 Thrombocytopenia, unspecified: Secondary | ICD-10-CM

## 2012-02-07 DIAGNOSIS — D72819 Decreased white blood cell count, unspecified: Secondary | ICD-10-CM

## 2012-02-09 ENCOUNTER — Other Ambulatory Visit: Payer: Self-pay | Admitting: Family Medicine

## 2012-02-09 DIAGNOSIS — D72819 Decreased white blood cell count, unspecified: Secondary | ICD-10-CM

## 2012-02-09 DIAGNOSIS — D696 Thrombocytopenia, unspecified: Secondary | ICD-10-CM

## 2012-02-15 ENCOUNTER — Ambulatory Visit (HOSPITAL_COMMUNITY)
Admission: RE | Admit: 2012-02-15 | Discharge: 2012-02-15 | Disposition: A | Discharge status: Home / Self Care | Payer: Managed Care, Other (non HMO) | Source: Ambulatory Visit | Attending: Family Medicine | Admitting: Family Medicine

## 2012-02-15 DIAGNOSIS — D72819 Decreased white blood cell count, unspecified: Secondary | ICD-10-CM

## 2012-02-15 DIAGNOSIS — D696 Thrombocytopenia, unspecified: Secondary | ICD-10-CM | POA: Insufficient documentation

## 2012-03-29 ENCOUNTER — Ambulatory Visit: Payer: Managed Care, Other (non HMO) | Admitting: Family Medicine

## 2012-11-04 ENCOUNTER — Ambulatory Visit (INDEPENDENT_AMBULATORY_CARE_PROVIDER_SITE_OTHER): Payer: Managed Care, Other (non HMO) | Admitting: Family Medicine

## 2012-11-04 ENCOUNTER — Encounter: Payer: Self-pay | Admitting: Family Medicine

## 2012-11-04 VITALS — BP 132/76 | HR 69 | Temp 98.6°F | Resp 16 | Ht 71.0 in | Wt 182.0 lb

## 2012-11-04 DIAGNOSIS — N5082 Scrotal pain: Secondary | ICD-10-CM

## 2012-11-04 DIAGNOSIS — N509 Disorder of male genital organs, unspecified: Secondary | ICD-10-CM

## 2012-11-04 MED ORDER — CIPROFLOXACIN HCL 500 MG PO TABS: 500.0000 mg | ORAL_TABLET | Freq: Two times a day (BID) | ORAL | Status: DC

## 2012-11-04 NOTE — Progress Notes (Signed)
OFFICE NOTE  11/04/2012  CC:  Chief Complaint  Patient presents with  . Groin Pain     HPI: Patient is a 42 y.o. Caucasian male who is here for here for pain in right side of scrotum. Onset about 3d/a, pretty much constant, moderate intensity at times and mild at others. Worse when touched.  The pain radiates up some into the right inguinal region.  No trauma.   Pertinent PMH:  Past Medical History  Diagnosis Date  . Heart murmur   . GERD (gastroesophageal reflux disease)   . Panic attacks     in the past  . Depression   . History of prostatitis   . Seasonal allergic rhinitis   . Family history of colon cancer   . Family history of breast cancer   . Left groin pain 2012    [facility] eval was reassuring; pt chose no further w/u (with surgeon or neurologist); ibuprofen was recommended.  . Radiculopathy of arm 2013    Neuro (Dr. Modesto Charon) eval; MRI C-spine did not explain the pain, awaiting NCS/EMGs as of 09/26/11.   Past Surgical History  Procedure Laterality Date  . Inguinal hernia repair      left  Vasectomy  MEDS:  Outpatient Prescriptions Prior to Visit  Medication Sig Dispense Refill  . Multiple Vitamin (MULTIVITAMIN) tablet Take 1 tablet by mouth daily.      . Omega-3 Fatty Acids (FISH OIL) 1000 MG CAPS Take 3 capsules by mouth daily.      . fluticasone (FLONASE) 50 MCG/ACT nasal spray Place 2 sprays into the nose daily.        Marland Kitchen loratadine-pseudoephedrine (CLARITIN-D 24-HOUR) 10-240 MG per 24 hr tablet Take 1 tablet by mouth daily.         No facility-administered medications prior to visit.    PE: Blood pressure 132/76, pulse 69, temperature 98.6 F (37 C), temperature source Temporal, resp. rate 16, height 5\' 11"  (1.803 m), weight 182 lb (82.555 kg), SpO2 100.00%. Gen: Alert, well appearing.  Patient is oriented to person, place, time, and situation. AFFECT: pleasant, lucid thought and speech. GU: uncircumcised penis, no tenderness or erythema or lesion.   No bulging or tenderness in the groin, groin creases, or upper thighs.  Left testicle without mass or tenderness.  No scrotal swelling on either side, no erythema of scrotum on either side.  He has moderate tenderness over a fairly firm lump in the area of the vas deferens about 1-2 cm superior to the right testicle.  Testicles are nontender.   IMPRESSION AND PLAN:  Right vas deferens nodule/tenderness--possible distended region proximal to the ligated vas?  Infection? He does have a hx of left groin pain that was also a bit hard to figure out. Will start empiric cipro 500mg  bid x 10d and he'll call if not improving some in 2d.  If not improving, will have him see his urologist +/- obtain scrotal ultrasound. I also recommended he take 600 mg ibuprofen bid-tid with food over the next 7-10d.  FOLLOW UP: prn (as noted above)

## 2013-05-07 ENCOUNTER — Emergency Department (HOSPITAL_COMMUNITY): Payer: Managed Care, Other (non HMO)

## 2013-05-07 ENCOUNTER — Telehealth: Payer: Self-pay | Admitting: Family Medicine

## 2013-05-07 ENCOUNTER — Observation Stay (HOSPITAL_COMMUNITY)
Admission: EM | Admit: 2013-05-07 | Discharge: 2013-05-07 | Disposition: A | Discharge status: Home / Self Care | Payer: Managed Care, Other (non HMO) | Attending: Internal Medicine | Admitting: Internal Medicine

## 2013-05-07 ENCOUNTER — Encounter (HOSPITAL_COMMUNITY): Payer: Self-pay | Admitting: Emergency Medicine

## 2013-05-07 DIAGNOSIS — R079 Chest pain, unspecified: Principal | ICD-10-CM | POA: Insufficient documentation

## 2013-05-07 DIAGNOSIS — F3289 Other specified depressive episodes: Secondary | ICD-10-CM | POA: Insufficient documentation

## 2013-05-07 DIAGNOSIS — R42 Dizziness and giddiness: Secondary | ICD-10-CM | POA: Insufficient documentation

## 2013-05-07 DIAGNOSIS — D696 Thrombocytopenia, unspecified: Secondary | ICD-10-CM | POA: Diagnosis present

## 2013-05-07 DIAGNOSIS — F329 Major depressive disorder, single episode, unspecified: Secondary | ICD-10-CM | POA: Insufficient documentation

## 2013-05-07 DIAGNOSIS — R011 Cardiac murmur, unspecified: Secondary | ICD-10-CM | POA: Insufficient documentation

## 2013-05-07 DIAGNOSIS — Z7982 Long term (current) use of aspirin: Secondary | ICD-10-CM | POA: Insufficient documentation

## 2013-05-07 DIAGNOSIS — F17200 Nicotine dependence, unspecified, uncomplicated: Secondary | ICD-10-CM | POA: Insufficient documentation

## 2013-05-07 DIAGNOSIS — K219 Gastro-esophageal reflux disease without esophagitis: Secondary | ICD-10-CM | POA: Insufficient documentation

## 2013-05-07 LAB — COMPREHENSIVE METABOLIC PANEL
ALT: 21 U/L (ref 0–53)
AST: 20 U/L (ref 0–37)
Albumin: 4.2 g/dL (ref 3.5–5.2)
Alkaline Phosphatase: 57 U/L (ref 39–117)
BILIRUBIN TOTAL: 0.8 mg/dL (ref 0.3–1.2)
BUN: 12 mg/dL (ref 6–23)
CHLORIDE: 102 meq/L (ref 96–112)
CO2: 27 meq/L (ref 19–32)
CREATININE: 0.93 mg/dL (ref 0.50–1.35)
Calcium: 9.4 mg/dL (ref 8.4–10.5)
GFR calc Af Amer: >90 mL/min (ref >90)
Glucose, Bld: 113 mg/dL — ABNORMAL HIGH (ref 70–99)
Potassium: 3.9 mEq/L (ref 3.7–5.3)
Sodium: 141 mEq/L (ref 137–147)
Total Protein: 7.2 g/dL (ref 6.0–8.3)

## 2013-05-07 LAB — CBC
HEMATOCRIT: 42.6 % (ref 39.0–52.0)
Hemoglobin: 15.5 g/dL (ref 13.0–17.0)
MCH: 32.5 pg (ref 26.0–34.0)
MCHC: 36.4 g/dL — ABNORMAL HIGH (ref 30.0–36.0)
MCV: 89.3 fL (ref 78.0–100.0)
Platelets: 122 10*3/uL — ABNORMAL LOW (ref 150–400)
RBC: 4.77 MIL/uL (ref 4.22–5.81)
RDW: 12.1 % (ref 11.5–15.5)
WBC: 3.3 10*3/uL — AB (ref 4.0–10.5)

## 2013-05-07 LAB — I-STAT TROPONIN, ED: TROPONIN I, POC: 0.01 ng/mL (ref 0.00–0.08)

## 2013-05-07 MED ORDER — ASPIRIN 81 MG PO CHEW
162.0000 mg | CHEWABLE_TABLET | Freq: Once | ORAL | Status: AC
  Administered 2013-05-07: 162 mg via ORAL
  Filled 2013-05-07: qty 2

## 2013-05-07 MED ORDER — SODIUM CHLORIDE 0.9 % IV SOLN
INTRAVENOUS | Status: DC
  Administered 2013-05-07: 14:00:00 via INTRAVENOUS

## 2013-05-07 NOTE — Telephone Encounter (Signed)
Patient is having chest pain and feels clammy. Advised patient's wife to take him to ER. She agreed.

## 2013-05-07 NOTE — Discharge Summary (Signed)
See H&P patient leaving AMA from the ER .   Patient at this time expresses desire to leave the Hospital immidiately, patient has been warned that this is not Medically advisable at this time, and can result in Medical complications like Death and Disability, patient understands and accepts the risks involved and assumes full responsibilty of this decision.

## 2013-05-07 NOTE — Telephone Encounter (Signed)
I agree with this triage.

## 2013-05-07 NOTE — H&P (Signed)
Patient Demographics  Anthony Gilmore, is a 43 y.o. male  MRN: [id number]   DOB - Apr 29, 1970  Admit Date - 05/07/2013  Outpatient Primary MD for the patient is Tammi Sou, MD   With History of -  Past Medical History  Diagnosis Date  . Heart murmur   . GERD (gastroesophageal reflux disease)   . Panic attacks     in the past  . Depression   . History of prostatitis   . Seasonal allergic rhinitis   . Family history of colon cancer   . Family history of breast cancer   . Left groin pain 2012    [facility] eval was reassuring; pt chose no further w/u (with surgeon or neurologist); ibuprofen was recommended.  . Radiculopathy of arm 2013    Neuro (Dr. Jacelyn Grip) eval; MRI C-spine did not explain the pain, awaiting NCS/EMGs as of 09/26/11.      Past Surgical History  Procedure Laterality Date  . Inguinal hernia repair      left    in for   Chief Complaint  Patient presents with  . Chest Pain     HPI  Anthony Gilmore  is a 43 y.o. male, with history of undiagnosed hypertension, smoker quit 10 months ago, no other known medical problems was sitting at his computer does this morning and started experiencing substernal sharp discomfort which was more pressure light sensation at times radiating to his back, made him slightly sweaty and anxious, this lasted for 30 minutes, went away on its own, he then came to the ER where his initial workup including chest x-ray, EKG, troponin were unremarkable. I was called to admit the patient for possible stress test to rule out underlying coronary ischemia.    Review of Systems  currently symptom free.  In addition to the HPI above,   No Fever-chills, No Headache, No changes with Vision or hearing, No problems swallowing food or Liquids, No Chest pain, Cough or  Shortness of Breath, No Abdominal pain, No Nausea or Vommitting, Bowel movements are regular, No Blood in stool or Urine, No dysuria, No new skin rashes or bruises, No new joints pains-aches,  No new weakness, tingling, numbness in any extremity, No recent weight gain or loss, No polyuria, polydypsia or polyphagia, No significant Mental Stressors.  A full 10 point Review of Systems was done, except as stated above, all other Review of Systems were negative.   Social History History  Substance Use Topics  . Smoking status: Current Some Day Smoker    Types: Cigarettes  . 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 Alcohol Use: 2.5 oz/week    5 drink(s) per week      Family History Family History  Problem Relation Age of Onset  . Breast cancer    . Colon cancer    . Cancer Mother     Breast, colon, lung  .  Cancer Sister 68    breast      Prior to Admission medications   Medication Sig Start Date End Date Taking? Authorizing Provider  aspirin 81 MG tablet Take 81 mg by mouth daily.   Yes Historical Provider, MD  Multiple Vitamin (MULTIVITAMIN) tablet Take 1 tablet by mouth daily.   Yes Historical Provider, MD  Omega-3 Fatty Acids (FISH OIL) 1000 MG CAPS Take 1,000 mg by mouth daily.    Yes Historical Provider, MD    No Known Allergies  Physical Exam  Vitals  Blood pressure 138/70, pulse 102, temperature 98.7 F (37.1 C), resp. rate 20, weight 85 kg (187 lb 6.3 oz), SpO2 100.00%.   1. General middle-aged white male lying in bed in NAD,    2. Normal affect and insight, Not Suicidal or Homicidal, Awake Alert, Oriented X 3.  3. No F.N deficits, ALL C.Nerves Intact, Strength 5/5 all 4 extremities, Sensation intact all 4 extremities, Plantars down going.  4. Ears and Eyes appear Normal, Conjunctivae clear, PERRLA. Moist Oral Mucosa.  5. Supple Neck, No JVD, No cervical lymphadenopathy appriciated, No Carotid Bruits.  6.  Symmetrical Chest wall movement, Good air movement bilaterally, CTAB.  7. RRR, No Gallops, Rubs or Murmurs, No Parasternal Heave.  8. Positive Bowel Sounds, Abdomen Soft, Non tender, No organomegaly appriciated,No rebound -guarding or rigidity.  9.  No Cyanosis, Normal Skin Turgor, No Skin Rash or Bruise.  10. Good muscle tone,  joints appear normal , no effusions, Normal ROM.  11. No Palpable Lymph Nodes in Neck or Axillae     Data Review  CBC  Recent Labs Lab 05/07/13 1309  WBC 3.3*  HGB 15.5  HCT 42.6  PLT 122*  MCV 89.3  MCH 32.5  MCHC 36.4*  RDW 12.1   ------------------------------------------------------------------------------------------------------------------  Chemistries   Recent Labs Lab 05/07/13 1309  NA 141  K 3.9  CL 102  CO2 27  GLUCOSE 113*  BUN 12  CREATININE 0.93  CALCIUM 9.4  AST 20  ALT 21  ALKPHOS 57  BILITOT 0.8   ------------------------------------------------------------------------------------------------------------------ CrCl is unknown because both a height and weight (above a minimum accepted value) are required for this calculation. ------------------------------------------------------------------------------------------------------------------ No results found for this basename: TSH, T4TOTAL, FREET3, T3FREE, THYROIDAB,  in the last 72 hours   Coagulation profile No results found for this basename: INR, PROTIME,  in the last 168 hours ------------------------------------------------------------------------------------------------------------------- No results found for this basename: DDIMER,  in the last 72 hours -------------------------------------------------------------------------------------------------------------------  Cardiac Enzymes No results found for this basename: CK, CKMB, TROPONINI, MYOGLOBIN,  in the last 168  hours ------------------------------------------------------------------------------------------------------------------ No components found with this basename: POCBNP,    ---------------------------------------------------------------------------------------------------------------  Urinalysis No results found for this basename: colorurine, appearanceur, labspec, phurine, glucoseu, hgbur, bilirubinur, ketonesur, proteinur, urobilinogen, nitrite, leukocytesur    ----------------------------------------------------------------------------------------------------------------  Imaging results:   Dg Chest 2 View  05/07/2013   CLINICAL DATA:  Central chest pain.  Dizziness.  EXAM: CHEST  2 VIEW  COMPARISON:  None.  FINDINGS: The heart size and mediastinal contours are within normal limits. Both lungs are clear. The visualized skeletal structures are unremarkable.  IMPRESSION: No active cardiopulmonary disease.   Electronically Signed   By: Earle Gell M.D.   On: 05/07/2013 14:12    My personal review of EKG: Rhythm NSR,  no Acute ST changes    Assessment & Plan    1. Chest pain. Has now resolved, however patient does have risk factors for CAD and story is  somewhat concerning for underlying ischemia, he was to be kept in the hospital for a stress test tomorrow, however after I saw the patient the ER PA came in to tell me that patient had decided earlier not to stay in the hospital.   I Personally talked to the patient, Patient at this time expresses desire to leave the Hospital immidiately, patient has been warned that this is not Medically advisable at this time, and can result in Medical complications like Death and Disability, patient understands and accepts the risks involved and assumes full responsibilty of this decision.   By simple follow with primary care physician and consider outpatient cardiology followup.    Code Status Full     Condition GUARDED    Time spent in  minutes : 35    SINGH,PRASHANT K M.D on 05/07/2013 at 2:52 PM  Between 7am to 7pm - Pager - [phone]  After 7pm go to [email or web address] - password [email or web address]  And look for the night coverage person covering me after hours  [facility] Office  [phone]

## 2013-05-07 NOTE — ED Notes (Signed)
Felt likescspear gun thru chest today  Some sob  No nausea has eased off now but something feels off

## 2013-05-07 NOTE — Discharge Instructions (Signed)
° °  YOU HAVE SIGNED OUT AGAINST MEDICAL ADVISE FROM THE EMERGENCY DEPARTMENT TODAY WE RECOMMENDED ADMISSION FOR A CARDIAC STRESS TEST TO EVALUATE IF THERE IS A BLOCKAGE IN YOUR HEART  PLEASE FOLLOW-UP WITH YOUR PRIMARY CARE DOCTOR FOR FURTHER WORKUP AND EVALUATION OR RETURN TO THE ER AS SOON AS POSSIBLE IF SYMPTOMS REOCCUR, CHANGE OR WORSEN.

## 2013-05-07 NOTE — ED Notes (Signed)
Patient transported to X-ray 

## 2013-05-07 NOTE — ED Provider Notes (Signed)
CSN: [id number]     Arrival date & time 05/07/13  1301 History   First MD Initiated Contact with Patient 05/07/13 1337     Chief Complaint  Patient presents with  . Chest Pain     (Consider location/radiation/quality/duration/timing/severity/associated sxs/prior Treatment) Patient is a 43 y.o. male presenting with chest pain.  Chest Pain Pain location:  L chest Pain quality: sharp and shooting   Pain radiates to:  L shoulder Pain radiates to the back: no   Pain severity:  Severe Onset quality:  Sudden Duration:  30 minutes Timing:  Rare Progression:  Resolved Chronicity:  New Context: breathing   Relieved by:  None tried Worsened by:  Nothing tried Ineffective treatments:  Aspirin Associated symptoms: anxiety and nausea   Associated symptoms: no abdominal pain, no back pain, no dizziness, no fever, no numbness, no palpitations and not vomiting   Risk factors: male sex   Risk factors: no coronary artery disease, no diabetes mellitus, not obese and no smoking     Patient was sitting at his desk at work when he had sudden onset of chest pains. He has never had CP before and has never seen a cardiologist or had a cardiac work-up. He reports that he has not been sick recently, doing more physical activity then normal and no hx of GERD. He tried drinking a glass of water and walking around to relieve the pain but it did not help. His wife called their PCP with Family Medicine who advised her to bring him to the ED.  Past Medical History  Diagnosis Date  . Heart murmur   . GERD (gastroesophageal reflux disease)   . Panic attacks     in the past  . Depression   . History of prostatitis   . Seasonal allergic rhinitis   . Family history of colon cancer   . Family history of breast cancer   . Left groin pain 2012    [facility] eval was reassuring; pt chose no further w/u (with surgeon or neurologist); ibuprofen was recommended.  . Radiculopathy of arm 2013    Neuro (Dr. Jacelyn Grip)  eval; MRI C-spine did not explain the pain, awaiting NCS/EMGs as of 09/26/11.   Past Surgical History  Procedure Laterality Date  . Inguinal hernia repair      left   Family History  Problem Relation Age of Onset  . Breast cancer    . Colon cancer    . Cancer Mother     Breast, colon, lung  . Cancer Sister 52    breast   History  Substance Use Topics  . Smoking status: Current Some Day Smoker    Types: Cigarettes  . 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 Alcohol Use: 2.5 oz/week    5 drink(s) per week    Review of Systems  Constitutional: Negative for fever.  Cardiovascular: Positive for chest pain. Negative for palpitations.  Gastrointestinal: Positive for nausea. Negative for vomiting and abdominal pain.  Musculoskeletal: Negative for back pain.  Neurological: Negative for dizziness and numbness.      Allergies  Review of patient's allergies indicates no known allergies.  Home Medications   Current Outpatient Rx  Name  Route  Sig  Dispense  Refill  . aspirin 81 MG tablet   Oral   Take 81 mg by mouth daily.         . Multiple Vitamin (MULTIVITAMIN) tablet  Oral   Take 1 tablet by mouth daily.         . Omega-3 Fatty Acids (FISH OIL) 1000 MG CAPS   Oral   Take 1,000 mg by mouth daily.           BP 151/88  Pulse 88  Temp(Src) 98.7 F (37.1 C)  Resp 16  Wt 187 lb 6.3 oz (85 kg)  SpO2 99% Physical Exam  Nursing note and vitals reviewed. Constitutional: He appears well-developed and well-nourished. No distress.  HENT:  Head: Normocephalic and atraumatic.  Eyes: Pupils are equal, round, and reactive to light.  Neck: Normal range of motion. Neck supple.  Cardiovascular: Normal rate and regular rhythm.   Pulmonary/Chest: Effort normal.  Abdominal: Soft.  Neurological: He is alert.  Skin: Skin is warm and dry.    ED Course  Procedures (including critical care time) Labs Review Labs Reviewed  CBC -  Abnormal; Notable for the following:    WBC 3.3 (*)    MCHC 36.4 (*)    Platelets 122 (*)    All other components within normal limits  COMPREHENSIVE METABOLIC PANEL - Abnormal; Notable for the following:    Glucose, Bld 113 (*)    All other components within normal limits  Randolm Idol, ED   Imaging Review Dg Chest 2 View  05/07/2013   CLINICAL DATA:  Central chest pain.  Dizziness.  EXAM: CHEST  2 VIEW  COMPARISON:  None.  FINDINGS: The heart size and mediastinal contours are within normal limits. Both lungs are clear. The visualized skeletal structures are unremarkable.  IMPRESSION: No active cardiopulmonary disease.   Electronically Signed   By: Earle Gell M.D.   On: 05/07/2013 14:12    EKG Interpretation   None       MDM   Final diagnoses:  Chest pain    Given IV saline, aspirin 324 mg, blood work ordered and chest xay. Pts EKG shows Normal sinus rhythm.  Pain has remained resolved in the ED. Since patients story is very concerning for true cardiac chest pain, I feel that a cardiac r/o is necessary since patient still feels "off" although pain free.  Admit to [facility] for obs/CP r/o, Tele, team [place], PA-C 05/07/13 1438   2:44pm  After admission patient has decided that he is not sure that he wants to stay. He is going to speak with his wife and then let me know.   2:52 pm- Hospitalist saw patient and he continues to endorse not wanting to stay. The hospitalist recommends that he signs out AMA and advises him that leaving is very dangerous. The patient voices his understanding of the risk up to having an MI or even death. He says he chooses to take these risks and follow up with his cardiologist.   Vital signs are stable at discharge. Filed Vitals:   05/07/13 1451  BP: 138/70  Pulse: 102  Temp:   Resp: 20    Patient/guardian has voiced understanding and agreed to follow-up with the PCP or specialist.   PT SIGNED OUT [place],  PA-C 05/07/13 1453

## 2013-05-07 NOTE — ED Provider Notes (Signed)
Medical screening examination/treatment/procedure(s) were performed by non-physician practitioner and as supervising physician I was immediately available for consultation/collaboration.  EKG Interpretation   None         Delice Bison Ward, DO 05/07/13 1521

## 2013-05-07 NOTE — ED Notes (Signed)
Pt leaving AMA. Pt has been educated about the risks of leaving and still wishes to leave.

## 2013-05-09 ENCOUNTER — Telehealth: Payer: Self-pay | Admitting: Family Medicine

## 2013-05-09 NOTE — Telephone Encounter (Signed)
Relevant patient education assigned to patient using Emmi. ° °

## 2014-03-13 DIAGNOSIS — R634 Abnormal weight loss: Secondary | ICD-10-CM

## 2014-03-13 HISTORY — DX: Abnormal weight loss: R63.4

## 2014-03-19 ENCOUNTER — Ambulatory Visit (INDEPENDENT_AMBULATORY_CARE_PROVIDER_SITE_OTHER): Payer: Managed Care, Other (non HMO) | Admitting: Family Medicine

## 2014-03-19 ENCOUNTER — Encounter: Payer: Self-pay | Admitting: Family Medicine

## 2014-03-19 VITALS — BP 106/72 | HR 68 | Temp 98.2°F | Resp 18 | Ht 71.0 in | Wt 175.0 lb

## 2014-03-19 DIAGNOSIS — J069 Acute upper respiratory infection, unspecified: Secondary | ICD-10-CM

## 2014-03-19 DIAGNOSIS — L720 Epidermal cyst: Secondary | ICD-10-CM

## 2014-03-19 DIAGNOSIS — J029 Acute pharyngitis, unspecified: Secondary | ICD-10-CM

## 2014-03-19 MED ORDER — AMOXICILLIN 875 MG PO TABS: 875.0000 mg | ORAL_TABLET | Freq: Two times a day (BID) | ORAL | Status: AC

## 2014-03-19 NOTE — Progress Notes (Signed)
Pre visit review using our clinic review tool, if applicable. No additional management support is needed unless otherwise documented below in the visit note. 

## 2014-03-19 NOTE — Progress Notes (Signed)
OFFICE NOTE  03/19/2014  CC:  Chief Complaint  Patient presents with  . URI    x month  . Cough  . Cyst    left side of face   HPI: Patient is a 44 y.o. Caucasian male who is here for respiratory complaints. Waxing and waning cold sx's x 1 mo: runny nose, ST, PND phlegmy cough, no fever.  No chest tightness or wheezing or SOB.   HA+.  No rash.   Feeling better last 24 h.  No body aches.    Left pre-auricular area with subQ cystic lesion that is slowly enlarging---has been there over a year.  Nontender. He asks that this be removed if possible.   Pertinent PMH:  Past medical, surgical, social, and family history reviewed and no changes are noted since last office visit.  MEDS:  Outpatient Prescriptions Prior to Visit  Medication Sig Dispense Refill  . aspirin 81 MG tablet Take 81 mg by mouth daily.    . Multiple Vitamin (MULTIVITAMIN) tablet Take 1 tablet by mouth daily.    . Omega-3 Fatty Acids (FISH OIL) 1000 MG CAPS Take 1,000 mg by mouth daily.      No facility-administered medications prior to visit.    PE: Blood pressure 106/72, pulse 68, temperature 98.2 F (36.8 C), temperature source Temporal, resp. rate 18, height 5\' 11"  (1.803 m), weight 175 lb (79.379 kg), SpO2 100 %. VS: noted--normal. Gen: alert, NAD, WELL APPEARING. HEENT: eyes without injection, drainage, or swelling.  Ears: EACs clear, TMs with normal light reflex and landmarks.  Nose: Clear, without rhinorrhea or injected turbinates.  No purulent d/c.  No paranasal sinus TTP.  No facial swelling.  Throat and mouth without focal lesion.  No pharyngial swelling, erythema, or exudate.   Left pre-auricular area with 1-2 cm firm subQ cystic nodule that is moveable and nontender. Neck: supple, no LAD.   LUNGS: CTA bilat, nonlabored resps.   CV: RRR, no m/r/g. EXT: no c/c/e SKIN: no rash  Rapid strep: NEG  IMPRESSION AND PLAN:  1) Prolonged URI, possible bacterial sinusitis vs recurrent URI's. Sent group A  strep culture.  Discussed approach to treatment for this. Decided on empiric trial of amoxil 875mg  bid x 10d.  Saline nasal spray 2-3 times a day encouraged, robitussin DM prn.  2) Left pre-auricular area keratin-filled cyst. Pt to return for me to excise this at a separate visit.  An After Visit Summary was printed and given to the patient.  FOLLOW UP: at his convenience for cyst removal

## 2014-03-20 LAB — CULTURE, GROUP A STREP

## 2014-04-10 ENCOUNTER — Encounter: Payer: Self-pay | Admitting: Family Medicine

## 2014-04-10 ENCOUNTER — Ambulatory Visit (INDEPENDENT_AMBULATORY_CARE_PROVIDER_SITE_OTHER): Payer: Managed Care, Other (non HMO) | Admitting: Family Medicine

## 2014-04-10 VITALS — BP 118/74 | HR 69 | Temp 98.5°F | Resp 16 | Ht 71.0 in | Wt 174.0 lb

## 2014-04-10 DIAGNOSIS — L720 Epidermal cyst: Secondary | ICD-10-CM

## 2014-04-10 NOTE — Progress Notes (Signed)
OFFICE NOTE  04/10/2014  CC:  Chief Complaint  Patient presents with  . Procedure   HPI: Patient is a 44 y.o. Caucasian male who is here for excision of a cyst from pre-auricular area on left side of face.   Pertinent PMH:  PMH and PSH reviewed. MEDS:  Outpatient Prescriptions Prior to Visit  Medication Sig Dispense Refill  . aspirin 81 MG tablet Take 81 mg by mouth daily.    . Multiple Vitamin (MULTIVITAMIN) tablet Take 1 tablet by mouth daily.    . Omega-3 Fatty Acids (FISH OIL) 1000 MG CAPS Take 1,000 mg by mouth daily.      No facility-administered medications prior to visit.    PE: Blood pressure 118/74, pulse 69, temperature 98.5 F (36.9 C), temperature source Temporal, resp. rate 16, height 5\' 11"  (1.803 m), weight 174 lb (78.926 kg), SpO2 100 %. Gen: Alert, well appearing.  Patient is oriented to person, place, time, and situation. Left pre-auricular area with 2 cm firm and moveable subQ nodule with a small, non-draining opening.  No erythema or tenderness. Neck: no LAD.  IMPRESSION AND PLAN:  keratinacious cyst/epidermal inclusion cyst: pt desires I &D today.  Procedure: Incision and drainage of left pre-auricular cyst.  The indication for the procedure was explained to the patient, benefits and risks of procedure were outlined for patient, patient agreed to proceed.  Steps of the procedure were clearly explained to the patient prior to starting. Injected lesion with 2 ml of 1% lidocaine without epinephrine for local anesthesia.  Incised perpendicular line just distal to the lesion so that the contents could be pushed forward through this slit.  Used manual pressure and hemostats to express contents and encourage complete drainage.  Wound was closed with super-glue and left uncovered.  It was c/d/intact at the end of the procedure when pt left the office.  Minimal bleeding during procedure.  Patient tolerated procedure well.  No immediate complications.  Wound care  instructions given.  Warning signs of infection discussed. Follow up discussed.  Call or return for problems.  An After Visit Summary was printed and given to the patient.  FOLLOW UP: prn

## 2014-04-10 NOTE — Progress Notes (Signed)
Pre visit review using our clinic review tool, if applicable. No additional management support is needed unless otherwise documented below in the visit note. 

## 2014-07-30 ENCOUNTER — Ambulatory Visit: Payer: Managed Care, Other (non HMO) | Admitting: Family Medicine

## 2014-07-31 ENCOUNTER — Other Ambulatory Visit: Payer: Self-pay | Admitting: Family Medicine

## 2014-07-31 ENCOUNTER — Ambulatory Visit (INDEPENDENT_AMBULATORY_CARE_PROVIDER_SITE_OTHER): Payer: Managed Care, Other (non HMO) | Admitting: Family Medicine

## 2014-07-31 ENCOUNTER — Ambulatory Visit (HOSPITAL_BASED_OUTPATIENT_CLINIC_OR_DEPARTMENT_OTHER)
Admission: RE | Admit: 2014-07-31 | Discharge: 2014-07-31 | Disposition: A | Discharge status: Home / Self Care | Payer: Managed Care, Other (non HMO) | Source: Ambulatory Visit | Attending: Family Medicine | Admitting: Family Medicine

## 2014-07-31 ENCOUNTER — Encounter: Payer: Self-pay | Admitting: Family Medicine

## 2014-07-31 VITALS — BP 98/56 | HR 84 | Temp 97.8°F | Ht 71.0 in | Wt 164.0 lb

## 2014-07-31 DIAGNOSIS — D696 Thrombocytopenia, unspecified: Secondary | ICD-10-CM

## 2014-07-31 DIAGNOSIS — Z87891 Personal history of nicotine dependence: Secondary | ICD-10-CM

## 2014-07-31 DIAGNOSIS — M79602 Pain in left arm: Secondary | ICD-10-CM

## 2014-07-31 DIAGNOSIS — D72819 Decreased white blood cell count, unspecified: Secondary | ICD-10-CM

## 2014-07-31 DIAGNOSIS — R202 Paresthesia of skin: Secondary | ICD-10-CM

## 2014-07-31 DIAGNOSIS — R634 Abnormal weight loss: Secondary | ICD-10-CM

## 2014-07-31 DIAGNOSIS — Z125 Encounter for screening for malignant neoplasm of prostate: Secondary | ICD-10-CM

## 2014-07-31 DIAGNOSIS — L720 Epidermal cyst: Secondary | ICD-10-CM

## 2014-07-31 DIAGNOSIS — K921 Melena: Secondary | ICD-10-CM

## 2014-07-31 LAB — CBC WITH DIFFERENTIAL/PLATELET
BASOS PCT: 0.6 % (ref 0.0–3.0)
Basophils Absolute: 0 10*3/uL (ref 0.0–0.1)
Eosinophils Absolute: 0.1 10*3/uL (ref 0.0–0.7)
Eosinophils Relative: 1.5 % (ref 0.0–5.0)
HEMATOCRIT: 45.4 % (ref 39.0–52.0)
Hemoglobin: 15.7 g/dL (ref 13.0–17.0)
Lymphocytes Relative: 22.1 % (ref 12.0–46.0)
Lymphs Abs: 1 10*3/uL (ref 0.7–4.0)
MCHC: 34.5 g/dL (ref 30.0–36.0)
MCV: 89.8 fl (ref 78.0–100.0)
MONO ABS: 0.3 10*3/uL (ref 0.1–1.0)
Monocytes Relative: 6.8 % (ref 3.0–12.0)
Neutro Abs: 3.1 10*3/uL (ref 1.4–7.7)
Neutrophils Relative %: 69 % (ref 43.0–77.0)
PLATELETS: 154 10*3/uL (ref 150.0–400.0)
RBC: 5.05 Mil/uL (ref 4.22–5.81)
RDW: 12.5 % (ref 11.5–15.5)
WBC: 4.6 10*3/uL (ref 4.0–10.5)

## 2014-07-31 LAB — URINALYSIS, ROUTINE W REFLEX MICROSCOPIC
Bilirubin Urine: NEGATIVE
Hgb urine dipstick: NEGATIVE
Ketones, ur: NEGATIVE
Leukocytes, UA: NEGATIVE
NITRITE: NEGATIVE
RBC / HPF: NONE SEEN (ref 0–?)
SPECIFIC GRAVITY, URINE: 1.02 (ref 1.000–1.030)
Total Protein, Urine: NEGATIVE
UROBILINOGEN UA: 0.2 (ref 0.0–1.0)
Urine Glucose: NEGATIVE
WBC UA: NONE SEEN (ref 0–?)
pH: 7.5 (ref 5.0–8.0)

## 2014-07-31 LAB — COMPREHENSIVE METABOLIC PANEL
ALBUMIN: 4.6 g/dL (ref 3.5–5.2)
ALT: 11 U/L (ref 0–53)
AST: 13 U/L (ref 0–37)
Alkaline Phosphatase: 57 U/L (ref 39–117)
BUN: 22 mg/dL (ref 6–23)
CALCIUM: 9.7 mg/dL (ref 8.4–10.5)
CO2: 30 mEq/L (ref 19–32)
CREATININE: 0.89 mg/dL (ref 0.40–1.50)
Chloride: 103 mEq/L (ref 96–112)
GFR: 99.42 mL/min (ref >60.00)
Glucose, Bld: 89 mg/dL (ref 70–99)
Potassium: 4.3 mEq/L (ref 3.5–5.1)
Sodium: 138 mEq/L (ref 135–145)
Total Bilirubin: 0.8 mg/dL (ref 0.2–1.2)
Total Protein: 6.8 g/dL (ref 6.0–8.3)

## 2014-07-31 LAB — T4, FREE: Free T4: 0.84 ng/dL (ref 0.60–1.60)

## 2014-07-31 LAB — TSH: TSH: 1.02 u[IU]/mL (ref 0.35–4.50)

## 2014-07-31 LAB — SEDIMENTATION RATE: Sed Rate: 6 mm/hr (ref 0–22)

## 2014-07-31 LAB — LIPASE: LIPASE: 75 U/L — AB (ref 11.0–59.0)

## 2014-07-31 LAB — PSA, MEDICARE: PSA: 0.71 ng/ml (ref 0.10–4.00)

## 2014-07-31 LAB — C-REACTIVE PROTEIN: CRP: <0.1 mg/dL — ABNORMAL LOW (ref 0.5–20.0)

## 2014-07-31 NOTE — Progress Notes (Signed)
OFFICE VISIT  08/01/2014   CC:  Chief Complaint  Patient presents with  . Cyst    on side of face has come back  . Weight Loss    20 lbs since Christmas   HPI:    Patient is a 44 y.o. Caucasian male who presents for recurrence of L pre-auricular cyst on face, says he has slowly felt the area begin to swell again.  No tenderness, no drainage, no redness.   I I&D'd this back in 03/2014 and was unable to remove the entire cyst capsule.  Also wants to discuss wt loss concerns.  Since christmas 2015, has lost 20+ lbs w/out trying. Describes stressful year at work but he noted no change in intake of calories, no appetite problems.  No change in activity level/calorie burning activities.  No chronic diarrhea or recurrent vomiting illnesses. Has felt stressed, body fatigue, tension type headaches.  The headaches are more of a daily occurrence lately. No unexplained fevers.  Occ orthostatic dizziness.  No vision or hearing complaints. No abdominal pain, no joint or bone pain.   Has had some ongoing (x years) neuropathic pain sx's/paresthesias from left shoulder/axilla area down left arm--no change. No rash.  No dysphagia/ondynophagia.  No BRBPR but has occ black/tarry sticky stool. No SOB or CP or cough.  No LE swelling.  No cold/heat intolerance, no polydipsia or polyuria.  No myalgias.  No alcohol or drug use.   Past Medical History  Diagnosis Date  . Heart murmur   . GERD (gastroesophageal reflux disease)   . Panic attacks     in the past  . Depression   . History of prostatitis   . Seasonal allergic rhinitis   . Family history of colon cancer   . Family history of breast cancer   . Left groin pain 2012    [facility] eval was reassuring; pt chose no further w/u (with surgeon or neurologist); ibuprofen was recommended.  . Radiculopathy of arm 2013    Neuro (Dr. Jacelyn Grip) eval; MRI C-spine did not explain the pain, NCS/EMG planned but never done.    Past Surgical History   Procedure Laterality Date  . Inguinal hernia repair      left    Outpatient Prescriptions Prior to Visit  Medication Sig Dispense Refill  . aspirin 81 MG tablet Take 81 mg by mouth daily.    . Multiple Vitamin (MULTIVITAMIN) tablet Take 1 tablet by mouth daily.    . Omega-3 Fatty Acids (FISH OIL) 1000 MG CAPS Take 1,000 mg by mouth daily.      No facility-administered medications prior to visit.    No Known Allergies  ROS As per HPI  PE: Blood pressure 98/56, pulse 84, temperature 97.8 F (36.6 C), temperature source Oral, height _0  (1.803 m), weight 164 lb (74.39 kg), SpO2 99 %. Gen: Alert, well appearing.  Patient is oriented to person, place, time, and situation. ENT: Ears: EACs clear, normal epithelium.  TMs with good light reflex and landmarks bilaterally.  Eyes: no injection, icteris, swelling, or exudate.  EOMI, PERRLA. Nose: no drainage or turbinate edema/swelling.  No injection or focal lesion.  Mouth: lips without lesion/swelling.  Oral mucosa pink and moist.  Dentition intact and without obvious caries or gingival swelling.  Oropharynx without erythema, exudate, or swelling.  Neck - No masses or thyromegaly or limitation in range of motion CV: RRR, no m/r/g.   LUNGS: CTA bilat, nonlabored resps, good aeration in all lung fields. ABD:  soft, NT, ND, BS normal.  No hepatospenomegaly or mass.  No bruits. EXT: no clubbing, cyanosis, or edema.  Musculoskeletal: no joint swelling, erythema, warmth, or tenderness.  ROM of all joints intact. Skin - no sores or suspicious lesions or rashes or color changes. Left pre-auricular 1 cm swelling that is mildly firm feeling, fluctuant, tiny opening in skin over the center but no drainage.  No erythema. CN 2-12 intact, no ataxia, no tremor   LABS:  Lab Results  Component Value Date   WBC 4.6 07/31/2014   HGB 15.7 07/31/2014   HCT 45.4 07/31/2014   MCV 89.8 07/31/2014   PLT 154.0 07/31/2014     Chemistry      Component  Value Date/Time   NA 138 07/31/2014 1211   K 4.3 07/31/2014 1211   CL 103 07/31/2014 1211   CO2 30 07/31/2014 1211   BUN 22 07/31/2014 1211   CREATININE 0.89 07/31/2014 1211      Component Value Date/Time   CALCIUM 9.7 07/31/2014 1211   ALKPHOS 57 07/31/2014 1211   AST 13 07/31/2014 1211   ALT 11 07/31/2014 1211   BILITOT 0.8 07/31/2014 1211     Peripheral smear review 02/06/2012:  Leukopenia. 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 No immature cells are identified. Platelets are unremarkable. No platelet clumps identified. RBCs are unremarkable.  IMPRESSION AND PLAN:  1) Abnormal weight loss, without any specific symtoms except fatigue and tension headaches. He does have a history of mild leukopenia and thrombocytopenia on hemograms since 2013, most recently 04/2013 (2013 w/u showed reassuring peripheral smear review noted above + normal abd u/s.  A hematology referral was made but it is unclear why this eval never happened--the status of the referral in the EMR says 'denied'). He also gives questionable history of intermittent melenotic stool and has a remote history of tobacco abuse. +FH of colon cancer in mother.   Will do CXR and general lab workup at this time: cbc w/diff, CMET, TSH, T4, T3, ESR, CRP, Lipase, HIV antibody, ANA, pathologist smear review, PSA, UA with reflex microscopy, and hemoccult cards x 3.  2) Left pre-auricular epidermal inclusion cyst, recurrence. He is interested in a more definitive excision, so I will refer him to the skin surgery center in [place].  3) Left shoulder/arm pain/paresthesias: Going on about approx 6-7 years now.  Neuro eval 2013: MRI C spine unremarkable.  Prior to that c and t spine plain films and left shoulder plain films were all normal. NCS/EMGs planned but never done-per pt's choice.  We will get NCS/EMGs done in near future, but I'm not sure if this has any connection to his current abnormal wt loss  issue.  Neurontin trial in the past helped his sx's but caused oversedation so pt chose to "just live with it"-not interested in other neuropathic med meds I've offered in the past.  An After Visit Summary was printed and given to the patient.  FOLLOW UP: Return in about 2 weeks (around 08/14/2014) for f/u abnormal wt loss.

## 2014-07-31 NOTE — Progress Notes (Signed)
Pre visit review using our clinic review tool, if applicable. No additional management support is needed unless otherwise documented below in the visit note. 

## 2014-08-01 LAB — T3: T3 TOTAL: 110.1 ng/dL (ref 80.0–204.0)

## 2014-08-01 LAB — HIV ANTIBODY (ROUTINE TESTING W REFLEX): HIV: NONREACTIVE

## 2014-08-03 LAB — PATHOLOGIST SMEAR REVIEW

## 2014-08-03 LAB — ANTINUCLEAR ANTIBODIES, IFA: ANA Titer 1: NEGATIVE

## 2014-08-04 ENCOUNTER — Other Ambulatory Visit: Payer: Self-pay | Admitting: Family Medicine

## 2014-08-04 DIAGNOSIS — R634 Abnormal weight loss: Secondary | ICD-10-CM

## 2014-08-04 DIAGNOSIS — R748 Abnormal levels of other serum enzymes: Secondary | ICD-10-CM

## 2014-08-07 ENCOUNTER — Other Ambulatory Visit (INDEPENDENT_AMBULATORY_CARE_PROVIDER_SITE_OTHER): Payer: Managed Care, Other (non HMO)

## 2014-08-07 ENCOUNTER — Other Ambulatory Visit: Payer: Self-pay | Admitting: *Deleted

## 2014-08-07 ENCOUNTER — Telehealth: Payer: Self-pay | Admitting: *Deleted

## 2014-08-07 ENCOUNTER — Other Ambulatory Visit: Payer: Self-pay

## 2014-08-07 DIAGNOSIS — R634 Abnormal weight loss: Secondary | ICD-10-CM

## 2014-08-07 LAB — FECAL OCCULT BLOOD, IMMUNOCHEMICAL: FECAL OCCULT BLD: NEGATIVE

## 2014-08-07 NOTE — Telephone Encounter (Signed)
Lynnett from [employer] [employer] called stating that she needs a new order for hemoccult x 3 to be put in. She states that the POCT for hemoccut card is ordered but that is not correct. I have put in new order for hemoccult x 3. Okay per Dr. Anitra Lauth.

## 2014-08-11 ENCOUNTER — Ambulatory Visit
Admission: RE | Admit: 2014-08-11 | Discharge: 2014-08-11 | Disposition: A | Discharge status: Home / Self Care | Payer: Managed Care, Other (non HMO) | Source: Ambulatory Visit | Attending: Family Medicine | Admitting: Family Medicine

## 2014-08-11 DIAGNOSIS — R634 Abnormal weight loss: Secondary | ICD-10-CM

## 2014-08-11 DIAGNOSIS — R748 Abnormal levels of other serum enzymes: Secondary | ICD-10-CM

## 2014-08-11 MED ORDER — IOPAMIDOL (ISOVUE-300) INJECTION 61%
100.0000 mL | Freq: Once | INTRAVENOUS | Status: AC | PRN
  Administered 2014-08-11: 100 mL via INTRAVENOUS

## 2014-08-12 ENCOUNTER — Encounter: Payer: Self-pay | Admitting: Family Medicine

## 2014-08-14 ENCOUNTER — Ambulatory Visit: Payer: Managed Care, Other (non HMO) | Admitting: Family Medicine

## 2014-09-11 ENCOUNTER — Encounter: Payer: Self-pay | Admitting: Family Medicine

## 2014-09-11 ENCOUNTER — Ambulatory Visit (INDEPENDENT_AMBULATORY_CARE_PROVIDER_SITE_OTHER): Payer: Managed Care, Other (non HMO) | Admitting: Family Medicine

## 2014-09-11 VITALS — BP 115/79 | HR 83 | Temp 98.5°F | Resp 16 | Ht 71.0 in | Wt 162.0 lb

## 2014-09-11 DIAGNOSIS — F331 Major depressive disorder, recurrent, moderate: Secondary | ICD-10-CM | POA: Diagnosis not present

## 2014-09-11 DIAGNOSIS — R748 Abnormal levels of other serum enzymes: Secondary | ICD-10-CM | POA: Diagnosis not present

## 2014-09-11 DIAGNOSIS — F411 Generalized anxiety disorder: Secondary | ICD-10-CM | POA: Diagnosis not present

## 2014-09-11 DIAGNOSIS — R634 Abnormal weight loss: Secondary | ICD-10-CM | POA: Diagnosis not present

## 2014-09-11 LAB — CBC WITH DIFFERENTIAL/PLATELET
BASOS PCT: 0.5 % (ref 0.0–3.0)
Basophils Absolute: 0 10*3/uL (ref 0.0–0.1)
Eosinophils Absolute: 0.1 10*3/uL (ref 0.0–0.7)
Eosinophils Relative: 1.1 % (ref 0.0–5.0)
HCT: 46.8 % (ref 39.0–52.0)
HEMOGLOBIN: 15.8 g/dL (ref 13.0–17.0)
LYMPHS ABS: 1 10*3/uL (ref 0.7–4.0)
Lymphocytes Relative: 20.8 % (ref 12.0–46.0)
MCHC: 33.9 g/dL (ref 30.0–36.0)
MCV: 91.7 fl (ref 78.0–100.0)
Monocytes Absolute: 0.4 10*3/uL (ref 0.1–1.0)
Monocytes Relative: 7.8 % (ref 3.0–12.0)
NEUTROS PCT: 69.8 % (ref 43.0–77.0)
Neutro Abs: 3.4 10*3/uL (ref 1.4–7.7)
PLATELETS: 161 10*3/uL (ref 150.0–400.0)
RBC: 5.1 Mil/uL (ref 4.22–5.81)
RDW: 12.7 % (ref 11.5–15.5)
WBC: 4.9 10*3/uL (ref 4.0–10.5)

## 2014-09-11 LAB — COMPREHENSIVE METABOLIC PANEL
ALBUMIN: 4.8 g/dL (ref 3.5–5.2)
ALK PHOS: 57 U/L (ref 39–117)
ALT: 16 U/L (ref 0–53)
AST: 15 U/L (ref 0–37)
BILIRUBIN TOTAL: 0.9 mg/dL (ref 0.2–1.2)
BUN: 23 mg/dL (ref 6–23)
CALCIUM: 10 mg/dL (ref 8.4–10.5)
CO2: 30 mEq/L (ref 19–32)
CREATININE: 1.06 mg/dL (ref 0.40–1.50)
Chloride: 101 mEq/L (ref 96–112)
GFR: 81.22 mL/min (ref >60.00)
GLUCOSE: 72 mg/dL (ref 70–99)
POTASSIUM: 5.2 meq/L — AB (ref 3.5–5.1)
SODIUM: 139 meq/L (ref 135–145)
Total Protein: 7.1 g/dL (ref 6.0–8.3)

## 2014-09-11 LAB — LIPASE: LIPASE: 62 U/L — AB (ref 11.0–59.0)

## 2014-09-11 MED ORDER — DULOXETINE HCL 30 MG PO CPEP: 30.0000 mg | ORAL_CAPSULE | Freq: Every day | ORAL | Status: DC

## 2014-09-11 NOTE — Progress Notes (Signed)
OFFICE NOTE  09/11/2014  CC:  Chief Complaint  Patient presents with  . Follow-up    Weight loss   HPI: Patient is a 44 y.o. Caucasian male who is here for 6 wk f/u abnormal wt loss. He says by his scale at home he has lost 4 lbs since last visit. Has been trying to eat breakfast, drink protein shakes, eat lunch daily, and eats supper nightly. Eating some protein bars occ for snacks lately.  Drinks 1-2 beers on weekends, otherwise coffee/water. When he eats he does have "rumbly stomach" and has mild loose BMs after eating more often lately. He has stopped exercising.  Feels depressed and anxious, all secondary to work stress.  Has felt this way at least the last 1 yr and it is getting worse.  He wonders if all the worry/anxiety over the last year at work could cause his wt loss. Says home life is fantastic.  He was on antidepressant in the past in college and this helped, and he cannot recall what it was. No SI or HI. Sleep is good, groggy in mornings.  No apneic events in sleep.   Pertinent PMH:  Past medical, surgical, social, and family history reviewed and no changes are noted since last office visit.  MEDS:  OTC Fish Oil 1000 mg po qd, MVI qd  PE: Blood pressure 115/79, pulse 83, temperature 98.5 F (36.9 C), temperature source Oral, resp. rate 16, height 5\' 11"  (1.803 m), weight 162 lb (73.483 kg), SpO2 98 %. Gen: Alert, well appearing.  Patient is oriented to person, place, time, and situation. CV: RRR, no m/r/g.   LUNGS: CTA bilat, nonlabored resps, good aeration in all lung fields. Skin - no sores or suspicious lesions or rashes or color changes ABD: soft, ND/NT EXT: no clubbing, cyanosis, or edema.    LABS: Lab Results  Component Value Date   TSH 1.02 07/31/2014   Lab Results  Component Value Date   WBC 4.6 07/31/2014   HGB 15.7 07/31/2014   HCT 45.4 07/31/2014   MCV 89.8 07/31/2014   PLT 154.0 07/31/2014   Lab Results  Component Value Date   CREATININE  0.89 07/31/2014   BUN 22 07/31/2014   NA 138 07/31/2014   K 4.3 07/31/2014   CL 103 07/31/2014   CO2 30 07/31/2014   Lab Results  Component Value Date   ALT 11 07/31/2014   AST 13 07/31/2014   ALKPHOS 57 07/31/2014   BILITOT 0.8 07/31/2014   Lab Results  Component Value Date   PSA 0.71 07/31/2014   IMPRESSION AND PLAN:  1) Abnormal wt loss; 25 pounds now since Christmas 2015. Slightly elevated lipase, but CT abd normal. iFOB was normal, but I would rather him get hemoccult cards x 3 to be more thorough in looking for occult GI blood loss, although his Hb and MCV are normal (as was the remainder of his lab work-up).   CXR was normal. He is essentially asymptomatic except for feeling very stressed from work and now lately having some IBS-type GI symptoms.   Will refer to GI for further evaluation, as I suspect an EGD and colonoscopy would be helpful at this juncture. Repeat lipase, CMET, and CBC with diff today. Pt is agreeable to this plan.  2) Major depression, with significant generalized anxiety disorder: will start trial of duloxetine 30mg  qd. Therapeutic expectations and side effect profile of medication discussed today.  Patient's questions answered.  An After Visit Summary was printed and  given to the patient.  FOLLOW UP: 4 wks

## 2014-09-11 NOTE — Progress Notes (Signed)
Pre visit review using our clinic review tool, if applicable. No additional management support is needed unless otherwise documented below in the visit note. 

## 2014-09-16 ENCOUNTER — Encounter: Payer: Self-pay | Admitting: Internal Medicine

## 2014-09-18 ENCOUNTER — Ambulatory Visit: Payer: Managed Care, Other (non HMO) | Admitting: Family Medicine

## 2014-09-18 ENCOUNTER — Telehealth: Payer: Self-pay | Admitting: Family Medicine

## 2014-09-18 NOTE — Telephone Encounter (Signed)
Pt aware.

## 2014-09-18 NOTE — Telephone Encounter (Signed)
Please advise 

## 2014-09-18 NOTE — Telephone Encounter (Signed)
Pls call pt and reassure him that this is a very common benign type of liver lesion and it has nothing to do with his abnormal wt loss.  Reassure, reassure, reassure.-thx

## 2014-09-18 NOTE — Telephone Encounter (Signed)
Patient noticed result note on MyChart for recent CT that stated there was a mass on liver. Please call patient.

## 2014-09-21 ENCOUNTER — Other Ambulatory Visit: Payer: Managed Care, Other (non HMO)

## 2014-09-21 ENCOUNTER — Other Ambulatory Visit: Payer: Self-pay | Admitting: *Deleted

## 2014-09-29 ENCOUNTER — Other Ambulatory Visit: Payer: Managed Care, Other (non HMO)

## 2014-09-30 ENCOUNTER — Other Ambulatory Visit: Payer: Self-pay | Admitting: Family Medicine

## 2014-09-30 ENCOUNTER — Other Ambulatory Visit (INDEPENDENT_AMBULATORY_CARE_PROVIDER_SITE_OTHER): Payer: Managed Care, Other (non HMO)

## 2014-09-30 DIAGNOSIS — R634 Abnormal weight loss: Secondary | ICD-10-CM

## 2014-10-01 LAB — FECAL OCCULT BLOOD, IMMUNOCHEMICAL: FECAL OCCULT BLD: NEGATIVE

## 2014-10-05 ENCOUNTER — Ambulatory Visit (INDEPENDENT_AMBULATORY_CARE_PROVIDER_SITE_OTHER): Payer: Managed Care, Other (non HMO) | Admitting: Family Medicine

## 2014-10-05 ENCOUNTER — Encounter: Payer: Self-pay | Admitting: Family Medicine

## 2014-10-05 VITALS — BP 110/71 | HR 76 | Temp 97.8°F | Resp 16 | Ht 71.0 in | Wt 160.0 lb

## 2014-10-05 DIAGNOSIS — F320 Major depressive disorder, single episode, mild: Secondary | ICD-10-CM | POA: Diagnosis not present

## 2014-10-05 DIAGNOSIS — R634 Abnormal weight loss: Secondary | ICD-10-CM | POA: Diagnosis not present

## 2014-10-05 DIAGNOSIS — F411 Generalized anxiety disorder: Secondary | ICD-10-CM

## 2014-10-05 NOTE — Progress Notes (Signed)
Pre visit review using our clinic review tool, if applicable. No additional management support is needed unless otherwise documented below in the visit note. 

## 2014-10-05 NOTE — Progress Notes (Signed)
OFFICE VISIT  10/05/2014   CC:  Chief Complaint  Patient presents with  . Follow-up    2 week f/u. Pt is not fasting.    HPI:    Patient is a 44 y.o. Caucasian male who presents for 1 mo f/u depression and anxiety. Last visit started cymbalta 30mg  qd.  Says he is feeling significant improvement in mood and anxiety. Appetite much improved.  No more upset/nervous stomach with IBS-type diarrhea pattern.  Also abnl wt loss: this has leveled off some but he has still lost 2 lb compared to his last o/v. Says he is making a point of eating BF, lunch, supper--trying to eat "a lot".  Not over-exercising by any means per pt report.  Past Medical History  Diagnosis Date  . Heart murmur   . GERD (gastroesophageal reflux disease)   . Panic attacks     in the past  . Depression   . History of prostatitis   . Seasonal allergic rhinitis   . Family history of colon cancer   . Family history of breast cancer   . Left groin pain 2012    [facility] eval was reassuring; pt chose no further w/u (with surgeon or neurologist); ibuprofen was recommended.  . Radiculopathy of arm 2013    Neuro (Dr. Jacelyn Grip) eval; MRI C-spine did not explain the pain, NCS/EMG planned but never done.  . Abnormal weight loss 2016    CXR normal.  Blood w/u normal except mildly elevated lipase, CT abd/pelv w/contrast normal.    Past Surgical History  Procedure Laterality Date  . Inguinal hernia repair      left    Outpatient Prescriptions Prior to Visit  Medication Sig Dispense Refill  . DULoxetine (CYMBALTA) 30 MG capsule Take 1 capsule (30 mg total) by mouth daily. 30 capsule 1  . Multiple Vitamin (MULTIVITAMIN) tablet Take 1 tablet by mouth daily.    . Omega-3 Fatty Acids (FISH OIL) 1000 MG CAPS Take 1,000 mg by mouth daily.     Marland Kitchen aspirin 81 MG tablet Take 81 mg by mouth daily.     No facility-administered medications prior to visit.    No Known Allergies  ROS As per HPI  PE: Blood pressure 110/71,  pulse 76, temperature 97.8 F (36.6 C), temperature source Oral, resp. rate 16, height 5\' 11"  (1.803 m), weight 160 lb (72.576 kg), SpO2 96 %. Gen: Alert, well appearing.  Patient is oriented to person, place, time, and situation. AFFECT: pleasant, lucid thought and speech. No further exam today.  LABS:   Lab Results  Component Value Date   TSH 1.02 07/31/2014   Lab Results  Component Value Date   WBC 4.9 09/11/2014   HGB 15.8 09/11/2014   HCT 46.8 09/11/2014   MCV 91.7 09/11/2014   PLT 161.0 09/11/2014   Lab Results  Component Value Date   CREATININE 1.06 09/11/2014   BUN 23 09/11/2014   NA 139 09/11/2014   K 5.2* 09/11/2014   CL 101 09/11/2014   CO2 30 09/11/2014   Lab Results  Component Value Date   ALT 16 09/11/2014   AST 15 09/11/2014   ALKPHOS 57 09/11/2014   BILITOT 0.9 09/11/2014   Lab Results  Component Value Date   PSA 0.71 07/31/2014   Lab Results  Component Value Date   LIPASE 62.0* 09/11/2014  Lipase on 07/31/14 was 75.    IMPRESSION AND PLAN:  1) Major depression, with significant GAD.  The current medical regimen  is effective;  continue present plan and medications.  We discussed the fact that he needs to stay on this med 9-12 mo before considering any ween off the med.  2) Abnl wt loss: this has slowed down.  No cause has been found, but he has GI appt set for 11/2014 and is on their cancellation list.    FOLLOW UP: Return in about 3 months (around 01/05/2015) for f/u mood/med/wt loss.

## 2014-10-21 ENCOUNTER — Encounter: Payer: Self-pay | Admitting: *Deleted

## 2014-11-02 ENCOUNTER — Other Ambulatory Visit: Payer: Self-pay | Admitting: *Deleted

## 2014-11-02 MED ORDER — DULOXETINE HCL 30 MG PO CPEP: 30.0000 mg | ORAL_CAPSULE | Freq: Every day | ORAL | Status: DC

## 2014-11-02 NOTE — Telephone Encounter (Signed)
RF request for duloxetine LOV: 10/05/14 Next ov: 01/08/15 Last written: 09/11/14 #30 w/ 1RF

## 2014-11-19 ENCOUNTER — Other Ambulatory Visit (INDEPENDENT_AMBULATORY_CARE_PROVIDER_SITE_OTHER): Payer: Managed Care, Other (non HMO)

## 2014-11-19 ENCOUNTER — Encounter: Payer: Self-pay | Admitting: Internal Medicine

## 2014-11-19 ENCOUNTER — Ambulatory Visit (INDEPENDENT_AMBULATORY_CARE_PROVIDER_SITE_OTHER): Payer: Managed Care, Other (non HMO) | Admitting: Internal Medicine

## 2014-11-19 VITALS — BP 92/68 | HR 64 | Ht 71.0 in | Wt 163.2 lb

## 2014-11-19 DIAGNOSIS — R194 Change in bowel habit: Secondary | ICD-10-CM | POA: Diagnosis not present

## 2014-11-19 DIAGNOSIS — Z800 Family history of malignant neoplasm of digestive organs: Secondary | ICD-10-CM

## 2014-11-19 DIAGNOSIS — Z8379 Family history of other diseases of the digestive system: Secondary | ICD-10-CM

## 2014-11-19 DIAGNOSIS — R634 Abnormal weight loss: Secondary | ICD-10-CM

## 2014-11-19 LAB — IGA: IGA: 139 mg/dL (ref 68–378)

## 2014-11-19 MED ORDER — NA SULFATE-K SULFATE-MG SULF 17.5-3.13-1.6 GM/177ML PO SOLN: ORAL | Status: DC

## 2014-11-19 NOTE — Progress Notes (Signed)
Patient ID: Anthony Gilmore, male   DOB: Jul 15, 1970, 44 y.o.   MRN: [id number] HPI: Anthony Gilmore is a 44 year old male with a past medical history of depression, inguinal hernia status post repair, who is seen in consultation at the request of Dr. Anitra Lauth to evaluate unintentional weight loss and decreased appetite. He's here alone today. He reports she's lost 25 pounds since December 2015. This is unintentional. Appetite was decreased though is been trying to eat more. He's noticed a change in bowel habit in the 80s having more urgent stools. At times loose at other times hard. Denies blood or melena. Has noticed mucus in the stool and possibly oily stools. No nausea or vomiting. No dysphagia or odynophagia. No heartburn. Some periodic mouth ulcers but none that failed to heal. 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 This involved layoffs for other employees and a change in his job description. Family history is strong for malignancy in his mother had colon cancer, breast cancer and lung cancer. He reports these were all 3 separate cancers. Sister has terminal breast cancer. Dad has ulcerative colitis. Was started on Cymbalta 2-3 months ago this has helped with some depressed mood and anxiety.  He has had several heme occult cards that were negative recently He also had a CT scan of the abdomen and pelvis which was largely unremarkable except for a benign appearing hemangioma of the liver. The study was personally reviewed  Past Medical History  Diagnosis Date  . Heart murmur   . GERD (gastroesophageal reflux disease)   . Panic attacks     in the past  . Depression   . History of prostatitis   . Seasonal allergic rhinitis   . Family history of colon cancer   . Family history of breast cancer   . Left groin pain 2012    [facility] eval was reassuring; pt chose no further w/u (with surgeon or neurologist); ibuprofen was  recommended.  . Radiculopathy of arm 2013    Neuro (Dr. Jacelyn Grip) eval; MRI C-spine did not explain the pain, NCS/EMG planned but never done.  . Abnormal weight loss 2016    CXR normal.  Blood w/u normal except mildly elevated lipase, CT abd/pelv w/contrast normal.    Past Surgical History  Procedure Laterality Date  . Inguinal hernia repair      left    Outpatient Prescriptions Prior to Visit  Medication Sig Dispense Refill  . DULoxetine (CYMBALTA) 30 MG capsule Take 1 capsule (30 mg total) by mouth daily. 30 capsule 3  . Multiple Vitamin (MULTIVITAMIN) tablet Take 1 tablet by mouth daily.    . Omega-3 Fatty Acids (FISH OIL) 1000 MG CAPS Take 1,000 mg by mouth daily.     Marland Kitchen aspirin 81 MG tablet Take 81 mg by mouth daily.     No facility-administered medications prior to visit.    No Known Allergies  Family History  Problem Relation Age of Onset  . Breast cancer Mother   . Colon cancer Mother   . Lung cancer Mother   . Breast cancer Sister 66    Social History  Substance Use Topics  . Smoking status: Former Smoker -- 1.50 packs/day for 11 years    Types: Cigarettes  . 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 Alcohol Use: 2.5 oz/week    5 Standard drinks or  equivalent per week    ROS: As per history of present illness, otherwise negative  BP 92/68 mmHg  Pulse 64  Ht 5\' 11"  (1.803 m)  Wt 163 lb 3.2 oz (74.027 kg)  BMI 22.77 kg/m2 Constitutional: Well-developed and well-nourished. No distress. HEENT: Normocephalic and atraumatic. Oropharynx is clear and moist. No oropharyngeal exudate. Conjunctivae are normal.  No scleral icterus. Neck: Neck supple. Trachea midline. Cardiovascular: Normal rate, regular rhythm and intact distal pulses. No M/R/G Pulmonary/chest: Effort normal and breath sounds normal. No wheezing, rales or rhonchi. Abdominal: Soft, nontender, nondistended. Bowel sounds active throughout. There are no masses palpable.  No hepatosplenomegaly. Extremities: no clubbing, cyanosis, or edema Lymphadenopathy: No cervical adenopathy noted. Neurological: Alert and oriented to person place and time. Skin: Skin is warm and dry. No rashes noted. Psychiatric: Normal mood and affect. Behavior is normal.  RELEVANT LABS AND IMAGING: CBC    Component Value Date/Time   WBC 4.9 09/11/2014 1010   RBC 5.10 09/11/2014 1010   HGB 15.8 09/11/2014 1010   HCT 46.8 09/11/2014 1010   PLT 161.0 09/11/2014 1010   MCV 91.7 09/11/2014 1010   MCH 32.5 05/07/2013 1309   MCHC 33.9 09/11/2014 1010   RDW 12.7 09/11/2014 1010   LYMPHSABS 1.0 09/11/2014 1010   MONOABS 0.4 09/11/2014 1010   EOSABS 0.1 09/11/2014 1010   BASOSABS 0.0 09/11/2014 1010    CMP     Component Value Date/Time   NA 139 09/11/2014 1010   K 5.2* 09/11/2014 1010   CL 101 09/11/2014 1010   CO2 30 09/11/2014 1010   GLUCOSE 72 09/11/2014 1010   BUN 23 09/11/2014 1010   CREATININE 1.06 09/11/2014 1010   CALCIUM 10.0 09/11/2014 1010   PROT 7.1 09/11/2014 1010   ALBUMIN 4.8 09/11/2014 1010   AST 15 09/11/2014 1010   ALT 16 09/11/2014 1010   ALKPHOS 57 09/11/2014 1010   BILITOT 0.9 09/11/2014 1010   GFRNONAA >90 05/07/2013 1309   GFRAA >90 05/07/2013 1309   CT ABDOMEN AND PELVIS WITH CONTRAST   TECHNIQUE: Multidetector CT imaging of the abdomen and pelvis was performed using the standard protocol following bolus administration of intravenous contrast.   CONTRAST:  151mL ISOVUE-300 IOPAMIDOL (ISOVUE-300) INJECTION 61%   COMPARISON:  None.   FINDINGS: Visualized lung bases appear normal. No significant osseous abnormality is noted.   No gallstones are noted. Focal enhancing abnormality is seen in left hepatic lobe anteriorly of most consistent with hemangioma. The spleen and pancreas appear normal. Adrenal glands and kidneys appear normal. No hydronephrosis or renal obstruction is noted. No renal or ureteral calculi are noted. The appendix  appears normal. There is no evidence of bowel obstruction. No abnormal fluid collection is noted. Urinary bladder appears normal. No significant adenopathy is noted.   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 No other significant abnormality seen in the abdomen or pelvis.     Electronically Signed   By: Marijo Conception, M.D.   On: 08/11/2014 16:07    ASSESSMENT/PLAN: 44 year old male with a past medical history of depression, inguinal hernia status post repair, who is seen in consultation at the request of Dr. Anitra Lauth to evaluate unintentional weight loss and decreased appetite.  1. Weight loss/change in bowel habit/family hx of malignancy and IBD -- given his weight loss and change in bowel habits along with strong family history I recommended upper endoscopy and colonoscopy. We discussed the test including the risks, benefits and alternatives and he is agreeable  to proceed. Am also checking a celiac panel. Stool test for fecal elastase and white blood cells. CT scan is encouraging that no malignancy was seen and he is also had heme-negative stool and normal CBC.    BB:[id number], [address] [place], [place] [id number]

## 2014-11-19 NOTE — Patient Instructions (Signed)
We have offered you an appointment for endoscopy and colonoscopy on 11/24/14. Unfortunately, you needed a later date. If you change your mind and decide to have the procedure 11/24/14, please call our office back to schedule (if the appointment is still available).  You have been scheduled for an endoscopy and colonoscopy. Please follow the written instructions given to you at your visit today. Please pick up your prep supplies at the pharmacy within the next 1-3 days. If you use inhalers (even only as needed), please bring them with you on the day of your procedure. Your physician has requested that you go to www.startemmi.com and enter the access code given to you at your visit today. This web site gives a general overview about your procedure. However, you should still follow specific instructions given to you by our office regarding your preparation for the procedure.  Your physician has requested that you go to the basement for the following lab work before leaving today: Celiac, Fecal elastace, fecal WBC  CC:Dr McGowen

## 2014-11-20 LAB — TISSUE TRANSGLUTAMINASE, IGA: Tissue Transglutaminase Ab, IgA: 1 U/mL (ref <4)

## 2014-11-23 ENCOUNTER — Other Ambulatory Visit: Payer: Managed Care, Other (non HMO)

## 2014-11-23 DIAGNOSIS — R634 Abnormal weight loss: Secondary | ICD-10-CM

## 2014-11-23 DIAGNOSIS — R194 Change in bowel habit: Secondary | ICD-10-CM

## 2014-11-24 ENCOUNTER — Encounter: Payer: Managed Care, Other (non HMO) | Admitting: Internal Medicine

## 2014-11-24 LAB — FECAL LACTOFERRIN, QUANT: LACTOFERRIN: NEGATIVE

## 2014-11-30 LAB — PANCREATIC ELASTASE, FECAL: Pancreatic Elastase-1, Stool: >500 mcg/g

## 2015-01-08 ENCOUNTER — Ambulatory Visit: Payer: Managed Care, Other (non HMO) | Admitting: Family Medicine

## 2015-01-20 ENCOUNTER — Encounter: Payer: Self-pay | Admitting: Internal Medicine

## 2015-01-20 ENCOUNTER — Ambulatory Visit (AMBULATORY_SURGERY_CENTER): Payer: Managed Care, Other (non HMO) | Admitting: Internal Medicine

## 2015-01-20 VITALS — BP 103/69 | HR 62 | Temp 97.5°F | Resp 18 | Ht 71.0 in | Wt 163.0 lb

## 2015-01-20 DIAGNOSIS — D125 Benign neoplasm of sigmoid colon: Secondary | ICD-10-CM

## 2015-01-20 DIAGNOSIS — R634 Abnormal weight loss: Secondary | ICD-10-CM

## 2015-01-20 DIAGNOSIS — K2970 Gastritis, unspecified, without bleeding: Secondary | ICD-10-CM | POA: Diagnosis not present

## 2015-01-20 DIAGNOSIS — R194 Change in bowel habit: Secondary | ICD-10-CM

## 2015-01-20 DIAGNOSIS — D122 Benign neoplasm of ascending colon: Secondary | ICD-10-CM | POA: Diagnosis not present

## 2015-01-20 DIAGNOSIS — K2950 Unspecified chronic gastritis without bleeding: Secondary | ICD-10-CM | POA: Diagnosis not present

## 2015-01-20 HISTORY — PX: ESOPHAGOGASTRODUODENOSCOPY: SHX1529

## 2015-01-20 HISTORY — PX: COLONOSCOPY W/ POLYPECTOMY: SHX1380

## 2015-01-20 MED ORDER — SODIUM CHLORIDE 0.9 % IV SOLN: 500.0000 mL | INTRAVENOUS | Status: DC

## 2015-01-20 NOTE — Progress Notes (Signed)
[place] advisory given to patient

## 2015-01-20 NOTE — Progress Notes (Signed)
A/ox3 pleased with MAC, report to Penny RN 

## 2015-01-20 NOTE — Patient Instructions (Signed)

## 2015-01-20 NOTE — Progress Notes (Signed)
Called to room to assist during endoscopic procedure.  Patient ID and intended procedure confirmed with present staff. Received instructions for my participation in the procedure from the performing physician.  

## 2015-01-20 NOTE — Op Note (Signed)
[place]  [address]. [place], [id number]   ENDOSCOPY PROCEDURE REPORT  PATIENT: Anthony Gilmore, Anthony Gilmore  MR#: [id number] BIRTHDATE: Aug 11, 1970 , 43  yrs. old GENDER: male ENDOSCOPIST: Jerene Bears, MD REFERRED BY:  Ricardo Jericho, M.D. PROCEDURE DATE:  01/20/2015 PROCEDURE:  EGD, diagnostic and EGD w/ biopsy ASA CLASS:     Class II INDICATIONS:  weight loss.  change in bowel habits. family history of IBD MEDICATIONS: Monitored anesthesia care and Propofol 150 mg IV TOPICAL ANESTHETIC: none  DESCRIPTION OF PROCEDURE: After the risks benefits and alternatives of the procedure were thoroughly explained, informed consent was obtained.  The LB [id number] endoscope was introduced through the mouth and advanced to the second portion of the duodenum , Without limitations.  The instrument was slowly withdrawn as the mucosa was fully examined.  ESOPHAGUS: The mucosa of the esophagus appeared normal.   Z-line regular at 40 cm.  STOMACH: Mild gastritis (inflammation) with scant heme was found in the gastric antrum.  Cold forcep biopsies were taken at the gastric body, antrum and angularis to evaluate for h.  pylori.   Remainder of stomach mucosa unremarkable.  DUODENUM: The duodenal mucosa showed no abnormalities in the bulb and 2nd part of the duodenum.  No evidence of celiac disease.  Retroflexed views revealed no abnormalities.     The scope was then withdrawn from the patient and the procedure completed.  COMPLICATIONS: There were no immediate complications.  ENDOSCOPIC IMPRESSION: 1.   The mucosa of the esophagus appeared normal 2.   Gastritis (inflammation) was found in the gastric antrum; multiple biopsies 3.   The duodenal mucosa showed no abnormalities in the bulb and 2nd part of the duodenum  RECOMMENDATIONS: 1.  Await biopsy results 2.  Follow-up of helicobacter pylori status, treat if indicated 3.  Proceed with colonoscopy  eSigned:  Jerene Bears, MD 01/20/2015 2:14 PM   CC: the patient, Dr. Anitra Lauth

## 2015-01-20 NOTE — Op Note (Signed)
[place]  [address]. [place], [id number]   COLONOSCOPY PROCEDURE REPORT  PATIENT: Anthony, Gilmore  MR#: [id number] BIRTHDATE: Oct 09, 1970 , 43  yrs. old GENDER: male ENDOSCOPIST: Jerene Bears, MD REFERRED YM:EBRAXEN Anitra Lauth, M.D. PROCEDURE DATE:  01/20/2015 PROCEDURE:   Colonoscopy, diagnostic and Colonoscopy with snare polypectomy First Screening Colonoscopy - Avg.  risk and is 50 yrs.  old or older - No.  Prior Negative Screening - Now for repeat screening. N/A  History of Adenoma - Now for follow-up colonoscopy & has been > or = to 3 yrs.  N/A  Polyps removed today? Yes ASA CLASS:   Class II INDICATIONS:weight loss, change in bowel habits, and family history of IBD. MEDICATIONS: Monitored anesthesia care, this was the total dose used for all procedures at this session, and Propofol 320 mg IV  DESCRIPTION OF PROCEDURE:   After the risks benefits and alternatives of the procedure were thoroughly explained, informed consent was obtained.  The digital rectal exam revealed no rectal mass.   The LB [id number]  endoscope was introduced through the anus and advanced to the terminal ileum which was intubated for a short distance. No adverse events experienced.   The quality of the prep was good.  (Suprep was used)  The mcg instrument was then slowly withdrawn as the colon was fully examined. Estimated blood loss is zero unless otherwise noted in this procedure report.    COLON FINDINGS: The examined terminal ileum appeared to be normal. Three sessile polyps ranging between 3-82mm in size were found in the ascending colon (1) and sigmoid colon (2).  Polypectomies were performed with a cold snare.  The resection was complete, the polyp tissue was completely retrieved and sent to histology.   There was mild diverticulosis noted in the ascending colon and sigmoid colon. Retroflexed views revealed no abnormalities. The time to cecum = 1.6 Withdrawal time =  11.9   The scope was withdrawn and the procedure completed.  COMPLICATIONS: There were no immediate complications.    ENDOSCOPIC IMPRESSION: 1.   The examined terminal ileum appeared to be normal 2.  Three sessile polyps ranging between 3-44mm in size were found in the ascending colon and sigmoid colon; polypectomies were performed with a cold snare 3.   Mild diverticulosis was noted in the ascending colon and sigmoid colon 4.   No evidence of inflammatory bowel disease or malignancy  RECOMMENDATIONS: 1.  Await pathology results 2.  Timing of repeat colonoscopy will be determined by pathology findings. 3.  You will receive a letter within 1-2 weeks with the results of your biopsy as well as final recommendations.  Please call my office if you have not received a letter after 3 weeks.  eSigned:  Jerene Bears, MD 01/20/2015 2:19 PM   cc:  the patient, Dr. Anitra Lauth   PATIENT NAME:  Anthony Gilmore, Anthony Gilmore MR#: [id number]

## 2015-01-21 ENCOUNTER — Telehealth: Payer: Self-pay | Admitting: *Deleted

## 2015-01-21 NOTE — Telephone Encounter (Signed)
  Follow up Call-  Call back number 01/20/2015  Post procedure Call Back phone  # [phone]  Permission to leave phone message No     Patient questions:  Do you have a fever, pain , or abdominal swelling? No. Pain Score  0 *  Have you tolerated food without any problems? Yes.    Have you been able to return to your normal activities? Yes.    Do you have any questions about your discharge instructions: Diet   No. Medications  No. Follow up visit  No.  Do you have questions or concerns about your Care? No.  Actions: * If pain score is 4 or above: No action needed, pain <4.

## 2015-01-26 ENCOUNTER — Encounter: Payer: Self-pay | Admitting: Internal Medicine

## 2015-02-24 ENCOUNTER — Telehealth: Payer: Self-pay | Admitting: *Deleted

## 2015-02-24 MED ORDER — DULOXETINE HCL 30 MG PO CPEP: 30.0000 mg | ORAL_CAPSULE | Freq: Every day | ORAL | Status: DC

## 2015-02-24 NOTE — Telephone Encounter (Signed)
Left message for pt to call back  °

## 2015-02-24 NOTE — Telephone Encounter (Signed)
RF request for duloxetine LOV: 10/05/14 Next ov: None Last written: 11/02/14 #30 w/ 6RF  Rx sent for #30 w/ 0RF. Pt needs ov for more refills.

## 2015-03-01 NOTE — Telephone Encounter (Signed)
Left detailed message on cell vm, okay per DPR.  

## 2015-03-09 ENCOUNTER — Ambulatory Visit (INDEPENDENT_AMBULATORY_CARE_PROVIDER_SITE_OTHER): Payer: Managed Care, Other (non HMO) | Admitting: Family Medicine

## 2015-03-09 ENCOUNTER — Encounter: Payer: Self-pay | Admitting: Family Medicine

## 2015-03-09 VITALS — BP 108/71 | HR 78 | Temp 98.4°F | Resp 16 | Ht 71.0 in | Wt 171.5 lb

## 2015-03-09 DIAGNOSIS — J3489 Other specified disorders of nose and nasal sinuses: Secondary | ICD-10-CM | POA: Diagnosis not present

## 2015-03-09 DIAGNOSIS — F418 Other specified anxiety disorders: Secondary | ICD-10-CM

## 2015-03-09 DIAGNOSIS — Z13220 Encounter for screening for lipoid disorders: Secondary | ICD-10-CM

## 2015-03-09 DIAGNOSIS — Z23 Encounter for immunization: Secondary | ICD-10-CM

## 2015-03-09 DIAGNOSIS — F329 Major depressive disorder, single episode, unspecified: Secondary | ICD-10-CM

## 2015-03-09 DIAGNOSIS — R634 Abnormal weight loss: Secondary | ICD-10-CM

## 2015-03-09 DIAGNOSIS — F32A Depression, unspecified: Secondary | ICD-10-CM

## 2015-03-09 DIAGNOSIS — F419 Anxiety disorder, unspecified: Principal | ICD-10-CM

## 2015-03-09 LAB — LIPID PANEL
CHOLESTEROL: 227 mg/dL — AB (ref 0–200)
HDL: 64.4 mg/dL (ref >39.00)
LDL CALC: 148 mg/dL — AB (ref 0–99)
NonHDL: 162.52
TRIGLYCERIDES: 71 mg/dL (ref 0.0–149.0)
Total CHOL/HDL Ratio: 4
VLDL: 14.2 mg/dL (ref 0.0–40.0)

## 2015-03-09 MED ORDER — DULOXETINE HCL 30 MG PO CPEP
30.0000 mg | ORAL_CAPSULE | Freq: Every day | ORAL | Status: DC
Start: 2015-03-09 — End: 2017-07-06

## 2015-03-09 NOTE — Progress Notes (Signed)
OFFICE VISIT  03/09/2015   CC:  Chief Complaint  Patient presents with  . Follow-up    medications. Pt is fasting.    HPI:    Patient is a 44 y.o. Caucasian male who presents for f/u major depression with history of GAD.  Feels like duloxetine helping, says he is quite happy/anxiety level stable.  Wants to continue current therapy.  Notes some blood in mucous when he blows nose, only from R nostril.  Only occasional spontaneous nose bleed.    Reviewed GI eval (EGD and colonoscopy earlier this year)--see PSH section updated. Appetite is good, wt has come up 9 lbs over the last 3 mo.    ROS: no arthralgias, myalgias, rash, or fevers.  No chest pain, no SOB, no cough. No dizziness, no HAs, no weakness.   Past Medical History  Diagnosis Date  . Heart murmur   . GERD (gastroesophageal reflux disease)   . Panic attacks     in the past  . Depression   . History of prostatitis   . Seasonal allergic rhinitis   . Family history of colon cancer   . Family history of breast cancer   . Left groin pain 2012    [facility] eval was reassuring; pt chose no further w/u (with surgeon or neurologist); ibuprofen was recommended.  . Radiculopathy of arm 2013    Neuro (Dr. Jacelyn Grip) eval; MRI C-spine did not explain the pain, NCS/EMG planned but never done.  . Abnormal weight loss 2016    CXR normal.  Blood w/u normal except mildly elevated lipase, CT abd/pelv w/contrast normal.    Past Surgical History  Procedure Laterality Date  . Inguinal hernia repair      left  . Colonoscopy w/ polypectomy  01/20/15    3 polyps--at least one adenomatous: recall 5 yrs.  No sign of IBD.   Marland Kitchen Esophagogastroduodenoscopy  01/20/15    mild chronic gastritis.  H pylori neg.  Bx's neg.    Outpatient Prescriptions Prior to Visit  Medication Sig Dispense Refill  . Multiple Vitamin (MULTIVITAMIN) tablet Take 1 tablet by mouth daily.    . Omega-3 Fatty Acids (FISH OIL) 1000 MG CAPS Take 1,000 mg by mouth  daily.     . DULoxetine (CYMBALTA) 30 MG capsule Take 1 capsule (30 mg total) by mouth daily. 30 capsule 0   No facility-administered medications prior to visit.    No Known Allergies  ROS As per HPI  PE: Blood pressure 108/71, pulse 78, temperature 98.4 F (36.9 C), temperature source Oral, resp. rate 16, height 5\' 11"  (1.803 m), weight 171 lb 8 oz (77.792 kg), SpO2 97 %. Gen: Alert, well appearing.  Patient is oriented to person, place, time, and situation. Nose: R nare shows slight crusty dryness of nasal septum, with small amount of dried blood present.   No polyp or pustule or erosions.  No purulent mucous.   Oropharynx clear.  LABS:  Lab Results  Component Value Date   TSH 1.02 07/31/2014   Lab Results  Component Value Date   WBC 4.9 09/11/2014   HGB 15.8 09/11/2014   HCT 46.8 09/11/2014   MCV 91.7 09/11/2014   PLT 161.0 09/11/2014   Lab Results  Component Value Date   CREATININE 1.06 09/11/2014   BUN 23 09/11/2014   NA 139 09/11/2014   K 5.2* 09/11/2014   CL 101 09/11/2014   CO2 30 09/11/2014   Lab Results  Component Value Date   ALT  16 09/11/2014   AST 15 09/11/2014   ALKPHOS 57 09/11/2014   BILITOT 0.9 09/11/2014   Lab Results  Component Value Date   CHOL 227* 03/09/2015   Lab Results  Component Value Date   HDL 64.40 03/09/2015   Lab Results  Component Value Date   LDLCALC 148* 03/09/2015   Lab Results  Component Value Date   TRIG 71.0 03/09/2015   Lab Results  Component Value Date   CHOLHDL 4 03/09/2015   Lab Results  Component Value Date   PSA 0.71 07/31/2014    IMPRESSION AND PLAN:  1) Depression and anxiety: The current medical regimen is effective;  continue present plan and medications.  2) Hx of abnormal wt loss: w/u unrevealing (blood testing and GI eval): pt now gaining wt normally, feels well.  3) Dry nasal septum/intermittent bloody nasal mucous: moisturization of nasal passages discussed: OTC saline nasal gel and/or  nasal spray.    4) Screening for hyperlipidemia: pt came in fasting today and requests screening for hyperlipidemia, so lipid panel was obtained.  An After Visit Summary was printed and given to the patient.  FOLLOW UP: Return for CPE in 8-12 mo (fasting).

## 2015-03-09 NOTE — Patient Instructions (Signed)
Buy OTC nasal moisturizing gel : brand name is AYR, but any OTC nasal moisturizing spray or gel is fine. Apply often to keep nose moist and bleeding will eventually stop.

## 2015-03-09 NOTE — Progress Notes (Signed)
Pre visit review using our clinic review tool, if applicable. No additional management support is needed unless otherwise documented below in the visit note. 

## 2015-03-14 DIAGNOSIS — J3489 Other specified disorders of nose and nasal sinuses: Secondary | ICD-10-CM

## 2015-03-14 HISTORY — DX: Other specified disorders of nose and nasal sinuses: J34.89

## 2015-08-13 ENCOUNTER — Encounter: Payer: Self-pay | Admitting: Family Medicine

## 2015-08-13 ENCOUNTER — Ambulatory Visit (INDEPENDENT_AMBULATORY_CARE_PROVIDER_SITE_OTHER): Payer: 59 | Admitting: Family Medicine

## 2015-08-13 VITALS — BP 108/70 | HR 71 | Temp 98.5°F | Resp 16 | Ht 71.0 in | Wt 171.5 lb

## 2015-08-13 DIAGNOSIS — J339 Nasal polyp, unspecified: Secondary | ICD-10-CM | POA: Diagnosis not present

## 2015-08-13 DIAGNOSIS — R040 Epistaxis: Secondary | ICD-10-CM | POA: Diagnosis not present

## 2015-08-13 NOTE — Progress Notes (Signed)
Pre visit review using our clinic review tool, if applicable. No additional management support is needed unless otherwise documented below in the visit note. 

## 2015-08-13 NOTE — Progress Notes (Signed)
OFFICE VISIT  08/13/2015   CC:  Chief Complaint  Patient presents with  . Epistaxis    x 6 months, has at least one nose bleed a day   HPI:    Patient is a 45 y.o. Caucasian male who presents for nosebleeds.  One nosebleed every day for about 6 mo. Has tried using saline nasal spray to moisturize nasal passages but no help.  Occ the bleeding occurs spontaneously but mostly it comes after blowing nose or manipulating nose a bit.  The bleeding is enough to drip and has to be stopped with tissue and pressure but does stop within a couple of minutes.  Always comes from R nostril.  No nose pain.  ROS: no blood in stools, no hematuria, no excessive/easy bruising.  Past Medical History  Diagnosis Date  . Heart murmur   . GERD (gastroesophageal reflux disease)   . Panic attacks     in the past  . Depression   . History of prostatitis   . Seasonal allergic rhinitis   . Family history of colon cancer   . Family history of breast cancer   . Left groin pain 2012    [facility] eval was reassuring; pt chose no further w/u (with surgeon or neurologist); ibuprofen was recommended.  . Radiculopathy of arm 2013    Neuro (Dr. Jacelyn Grip) eval; MRI C-spine did not explain the pain, NCS/EMG planned but never done.  . Abnormal weight loss 2016    CXR normal.  Blood w/u normal except mildly elevated lipase, CT abd/pelv w/contrast normal.    Past Surgical History  Procedure Laterality Date  . Inguinal hernia repair      left  . Colonoscopy w/ polypectomy  01/20/15    3 polyps--at least one adenomatous: recall 5 yrs.  No sign of IBD.   Marland Kitchen Esophagogastroduodenoscopy  01/20/15    mild chronic gastritis.  H pylori neg.  Bx's neg.    Outpatient Prescriptions Prior to Visit  Medication Sig Dispense Refill  . DULoxetine (CYMBALTA) 30 MG capsule Take 1 capsule (30 mg total) by mouth daily. 90 capsule 3  . Multiple Vitamin (MULTIVITAMIN) tablet Take 1 tablet by mouth daily.    . Omega-3 Fatty Acids (FISH  OIL) 1000 MG CAPS Take 1,000 mg by mouth daily.      No facility-administered medications prior to visit.    No Known Allergies  ROS As per HPI  PE: Blood pressure 108/70, pulse 71, temperature 98.5 F (36.9 C), temperature source Oral, resp. rate 16, height 5\' 11"  (1.803 m), weight 171 lb 8 oz (77.792 kg), SpO2 95 %. Gen: Alert, well appearing.  Patient is oriented to person, place, time, and situation. ENT: L nostril clear, normal mucosa R nostril clear of debris/blood, and the nasal mucosa appears normal except for a focal septal nodular lesion that is about 2 mm in size, smooth-surfaced, and has dried blood on the surface.  LABS:  Lab Results  Component Value Date   WBC 4.9 09/11/2014   HGB 15.8 09/11/2014   HCT 46.8 09/11/2014   MCV 91.7 09/11/2014   PLT 161.0 09/11/2014     IMPRESSION AND PLAN:  Epistaxis, with R sided septal polyp/nodule. Refer to ENT for further evaluation and management--ordered today.  An After Visit Summary was printed and given to the patient.  FOLLOW UP: Return if symptoms worsen or fail to improve.  Signed:  Crissie Sickles, MD           08/13/2015

## 2015-09-15 ENCOUNTER — Encounter: Payer: Self-pay | Admitting: Family Medicine

## 2016-08-29 ENCOUNTER — Ambulatory Visit: Payer: Self-pay | Admitting: Family Medicine

## 2016-12-21 IMAGING — CT CT ABD-PELV W/ CM
3 of 5 series · 13 of 36 positions shown, 19 images · IV contrast (READICAT/WATER & [ID] ISOVUE 300)
Comparison: None.

CLINICAL DATA: Weight loss.  Mildly elevated lipase level.

EXAM:
CT ABDOMEN AND PELVIS WITH CONTRAST
TECHNIQUE: Multidetector CT imaging of the abdomen and pelvis was performed
using the standard protocol following bolus administration of
intravenous contrast.
CONTRAST:  100mL OP0PD7-YJJ IOPAMIDOL (OP0PD7-YJJ) INJECTION 61%

[Series 3: abd/pelvis with · axial · 0.70mm/px · z∈[-332,+12]mm · 8 of 89 slices shown, 13 images]
[im 10/89  soft-tissue]
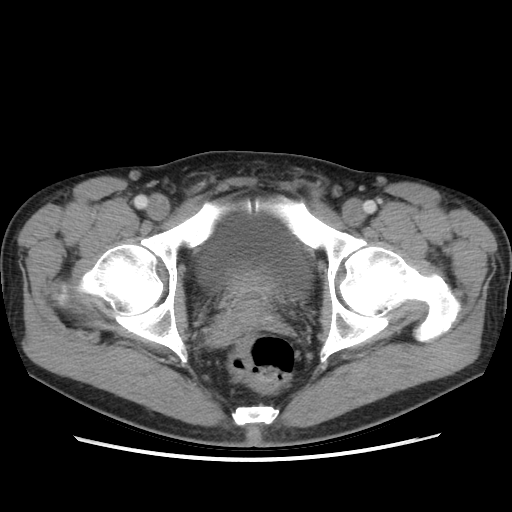
[im 10/89  bone]
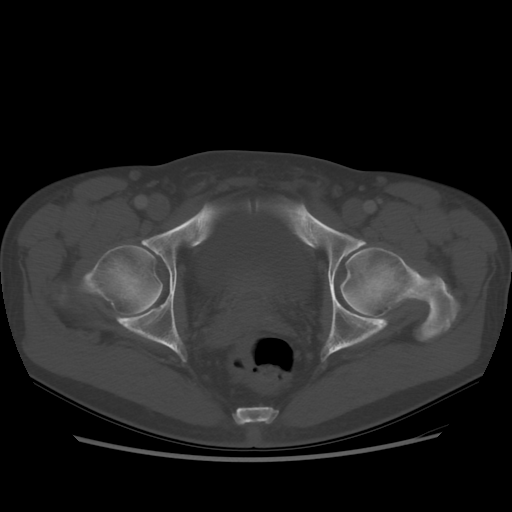
[im 20/89  soft-tissue]
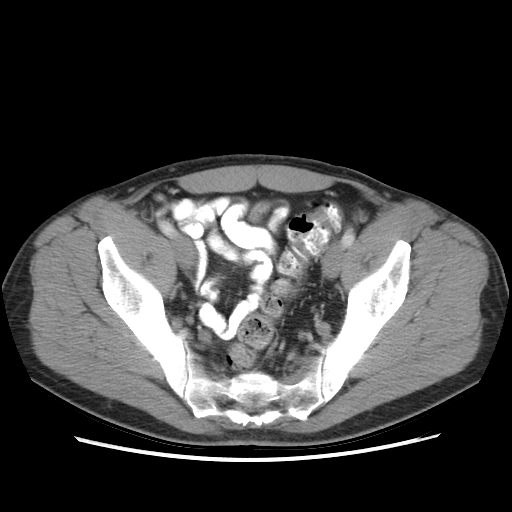
[im 30/89  soft-tissue]
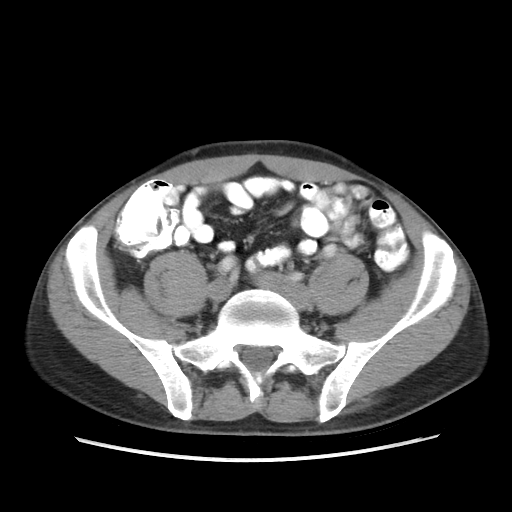
[im 40/89  soft-tissue]
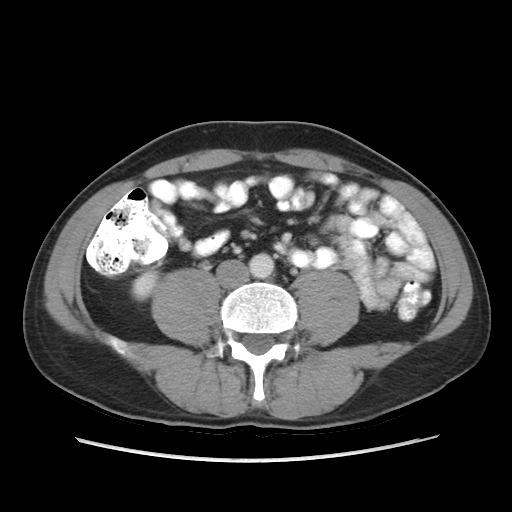
[im 49/89  soft-tissue]
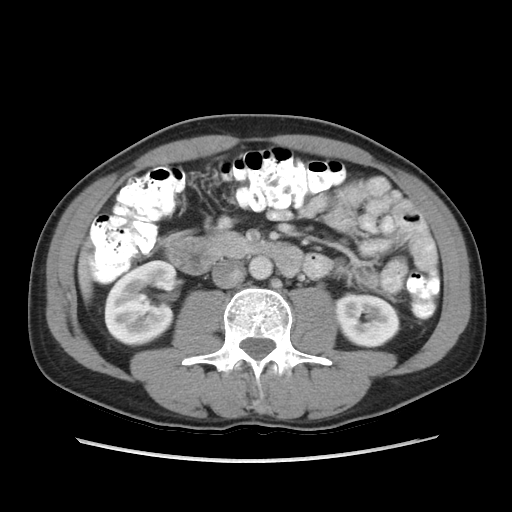
[im 49/89  lung]
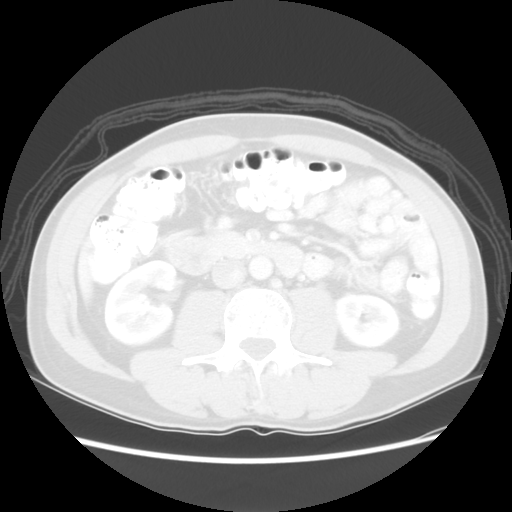
[im 59/89  soft-tissue]
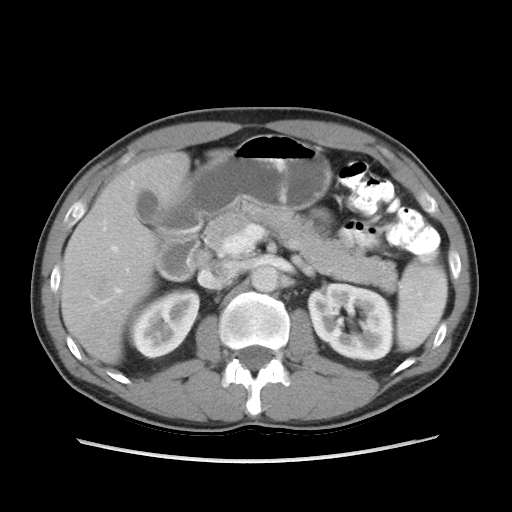
[im 59/89  lung]
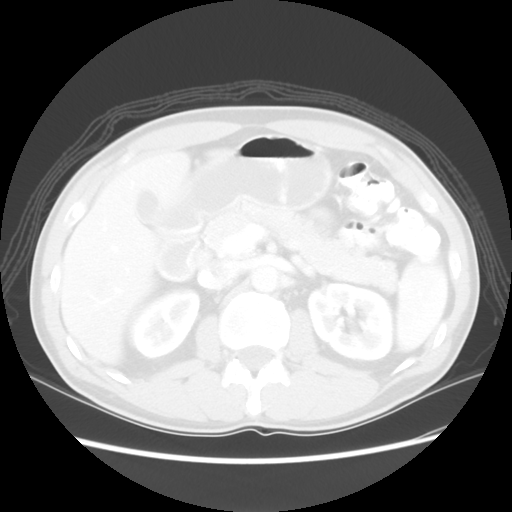
[im 69/89  soft-tissue]
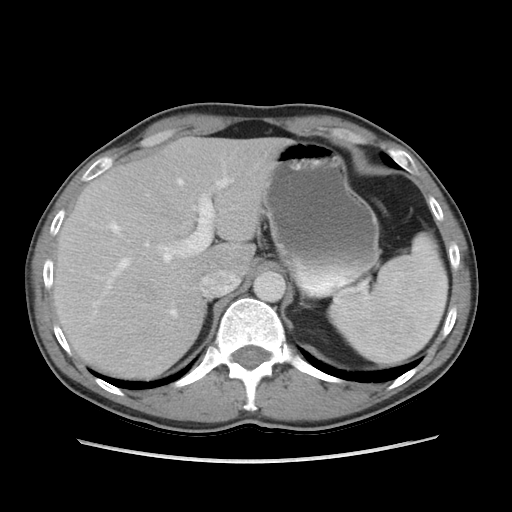
[im 69/89  lung]
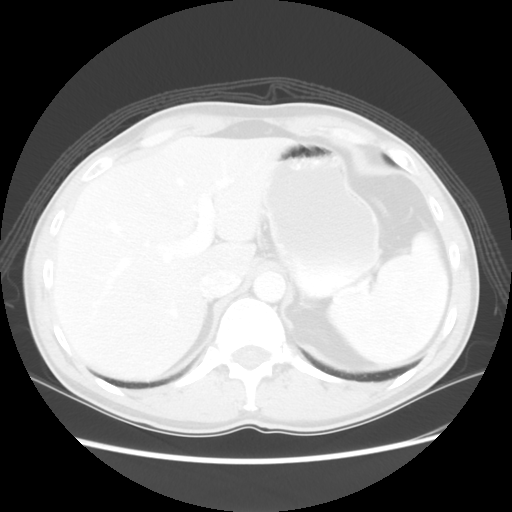
[im 79/89  soft-tissue]
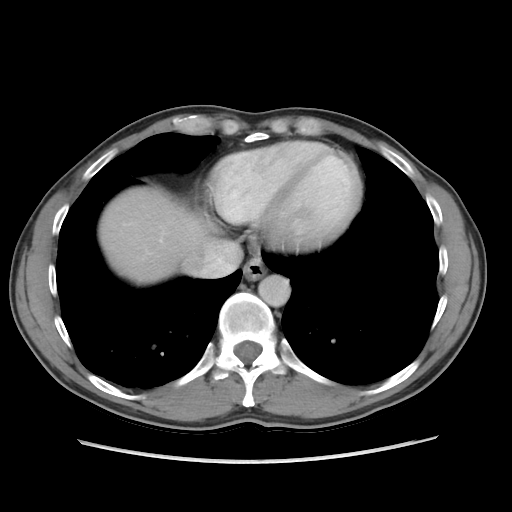
[im 79/89  lung]
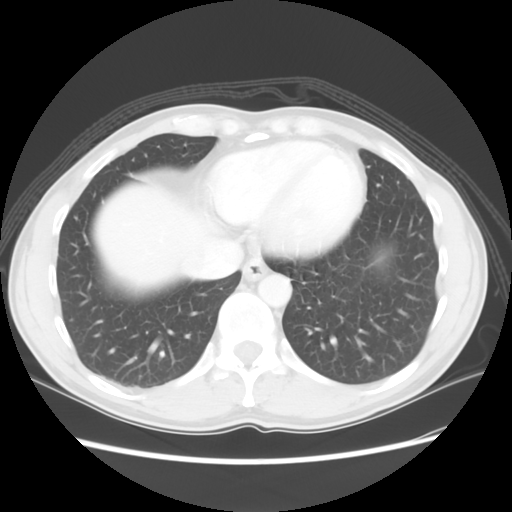

[Series 601: coronal body · coronal · 0.95mm/px · 1 of 101 slices shown, 2 images]
[im 34/101  soft-tissue]
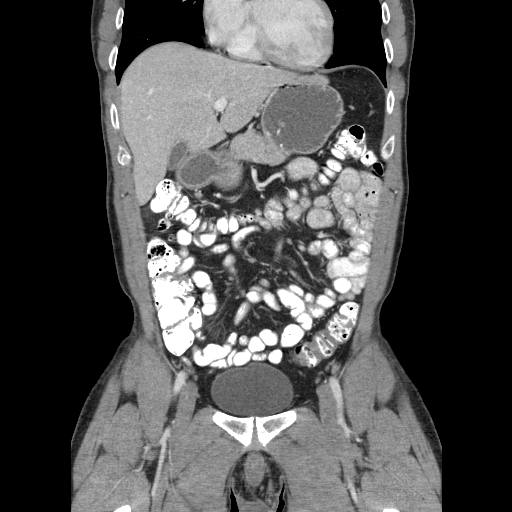
[im 34/101  bone]
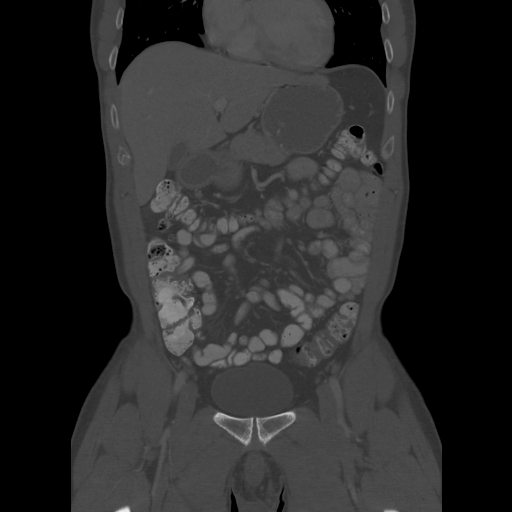

[Series 602: sagittal body · sagittal · 0.95mm/px · 4 of 145 slices shown]
[im 10/145  soft-tissue]
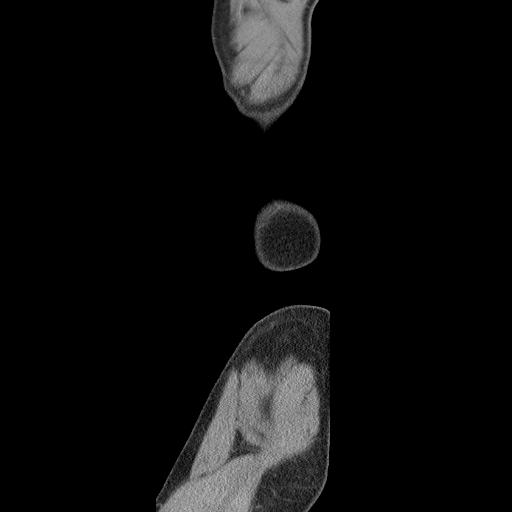
[im 29/145  soft-tissue]
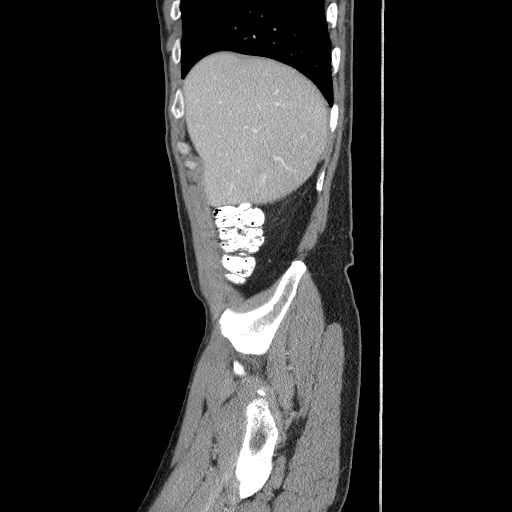
[im 49/145  soft-tissue]
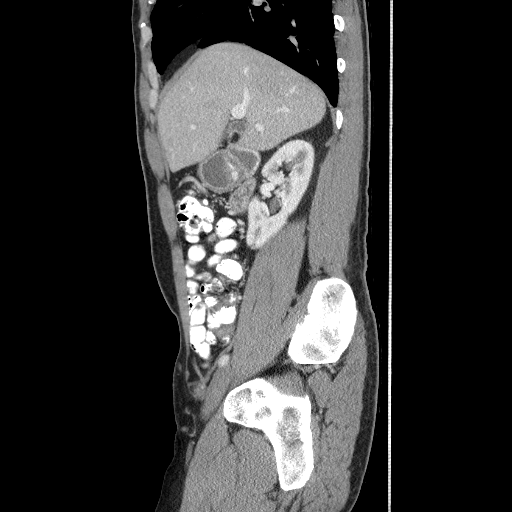
[im 68/145  soft-tissue]
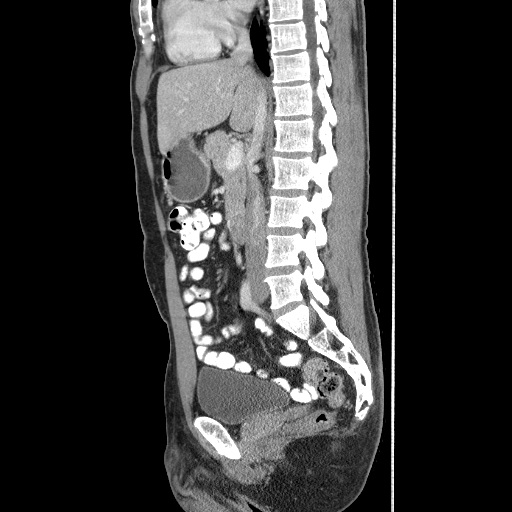

[13 of 36 positions shown; findings below may reference images not displayed]

FINDINGS: Visualized lung bases appear normal. No significant osseous
abnormality is noted.

No gallstones are noted. Focal enhancing abnormality is seen in left
hepatic lobe anteriorly of most consistent with hemangioma. The
spleen and pancreas appear normal. Adrenal glands and kidneys appear
normal. No hydronephrosis or renal obstruction is noted. No renal or
ureteral calculi are noted. The appendix appears normal. There is no
evidence of bowel obstruction. No abnormal fluid collection is
noted. Urinary bladder appears normal. No significant adenopathy is
noted.
IMPRESSION: Probable hemangioma seen in left hepatic lobe. No other significant
abnormality seen in the abdomen or pelvis.

## 2017-07-04 ENCOUNTER — Other Ambulatory Visit: Payer: Self-pay | Admitting: Family Medicine

## 2017-07-04 NOTE — Telephone Encounter (Signed)
Copied from [place]. Topic: Quick Communication - Rx Refill/Question >> Jul 04, 2017 11:38 AM Oliver Pila B wrote: Medication: DULoxetine (CYMBALTA) 30 MG capsule [[id number]]  Has the patient contacted their pharmacy? Yes.   (Agent: If no, request that the patient contact the pharmacy for the refill.) Preferred Pharmacy (with phone number or street name): [facility] Agent: Please be advised that RX refills may take up to 3 business days. We ask that you follow-up with your pharmacy.

## 2017-07-05 NOTE — Telephone Encounter (Signed)
Cymbalta Last OV: 04/2015; Upcoming appointment 07/06/17 Last filled: 2016 [id number] Pharmacy: [id number] [place], [place] [phone] (Phone) [phone] (Fax)

## 2017-07-06 ENCOUNTER — Encounter: Payer: Self-pay | Admitting: Family Medicine

## 2017-07-06 ENCOUNTER — Ambulatory Visit: Payer: 59 | Admitting: Family Medicine

## 2017-07-06 VITALS — BP 117/74 | HR 84 | Temp 98.5°F | Ht 71.0 in | Wt 184.2 lb

## 2017-07-06 DIAGNOSIS — F331 Major depressive disorder, recurrent, moderate: Secondary | ICD-10-CM | POA: Diagnosis not present

## 2017-07-06 DIAGNOSIS — F4000 Agoraphobia, unspecified: Secondary | ICD-10-CM

## 2017-07-06 DIAGNOSIS — F411 Generalized anxiety disorder: Secondary | ICD-10-CM

## 2017-07-06 MED ORDER — DULOXETINE HCL 30 MG PO CPEP: 30.0000 mg | ORAL_CAPSULE | Freq: Every day | ORAL | 1 refills | Status: DC

## 2017-07-06 MED ORDER — LORAZEPAM 0.5 MG PO TABS: ORAL_TABLET | ORAL | 1 refills | Status: DC

## 2017-07-06 NOTE — Progress Notes (Signed)
OFFICE VISIT  07/06/2017   CC:  Chief Complaint  Patient presents with  . Follow-up    Anxiety/Depression (Wants to go on CYMBALTA)   HPI:    Patient is a 47 y.o. Caucasian male who presents for depression and anxiety. I have not seen him for follow up of this since 02/2015, at which time he was doing well and we continued him on cymbalta 30 mg qd.  He stopped the cymbalta around 02/2016 b/c he had felt good for a while. About 6-8 mo later he noted onset of depressed/sad mood.  Poor mood, anxious/worried all the time, wakes up frequently in sleep. Occ gets some dizziness with extremes of worry/anxiety but no panic.  +agoraphobic.  NO SI or HI. Appetite gone for the last week.   Worse the last 2 mo, esp the last 1 wk.  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  No drugs.  Tried CBD but no help. Relationships with wife and kids: "great". Very stressed at work. Trying to exercise.    ROS: no HAs, no CP, no SOB, no palpitations, no rash, no diarrhea, no tremulousness, no vision c/o  Past Medical History:  Diagnosis Date  . Abnormal weight loss 2016   CXR normal.  Blood w/u normal except mildly elevated lipase, CT abd/pelv w/contrast normal.  . Depression   . Family history of breast cancer   . Family history of colon cancer   . GERD (gastroesophageal reflux disease)   . Heart murmur   . History of prostatitis   . Left groin pain 2012   [facility] eval was reassuring; pt chose no further w/u (with surgeon or neurologist); ibuprofen was recommended.  . Nasal septal granuloma 2017   Cauterized by Dr. Erik Obey  . Panic attacks    in the past  . Radiculopathy of arm 2013   Neuro (Dr. Jacelyn Grip) eval; MRI C-spine did not explain the pain, NCS/EMG planned but never done.  . Seasonal allergic rhinitis     Past Surgical History:  Procedure Laterality Date  . COLONOSCOPY W/ POLYPECTOMY  01/20/15   3 polyps--at least one adenomatous: recall 5 yrs.  No sign of IBD.   Marland Kitchen  ESOPHAGOGASTRODUODENOSCOPY  01/20/15   mild chronic gastritis.  H pylori neg.  Bx's neg.  . INGUINAL HERNIA REPAIR     left   MEDS: none currently  No Known Allergies  ROS As per HPI  PE: Blood pressure 117/74, pulse 84, temperature 98.5 F (36.9 C), temperature source Oral, height 5\' 11"  (1.803 m), weight 184 lb 3.2 oz (83.6 kg), SpO2 96 %. Wt Readings from Last 2 Encounters:  07/06/17 184 lb 3.2 oz (83.6 kg)  08/13/15 171 lb 8 oz (77.8 kg)    Gen: alert, oriented x 4, affect pleasant.  Lucid thinking and conversation noted. HEENT: PERRLA, EOMI.   Neck: no LAD, mass, or thyromegaly. CV: RRR, no m/r/g LUNGS: CTA bilat, nonlabored. NEURO: no tremor or tics noted on observation.  Coordination intact. CN 2-12 grossly intact bilaterally, strength 5/5 in all extremeties.  No ataxia.   LABS:    Chemistry      Component Value Date/Time   NA 139 09/11/2014 1010   K 5.2 (H) 09/11/2014 1010   CL 101 09/11/2014 1010   CO2 30 09/11/2014 1010   BUN 23 09/11/2014 1010   CREATININE 1.06 09/11/2014 1010      Component Value Date/Time   CALCIUM 10.0 09/11/2014 1010   ALKPHOS  57 09/11/2014 1010   AST 15 09/11/2014 1010   ALT 16 09/11/2014 1010   BILITOT 0.9 09/11/2014 1010       IMPRESSION AND PLAN:  Recurrent MDD and GAD with agoraphobia. Restart duloxetine 30mg  qd --which has helped him very well in the past. Lorazepam 0.5mg , 1-2 tabs bid prn, #30, RF x 1. Therapeutic expectations and side effect profile of medications discussed today.  Patient's questions answered. Hold off on counseling at this time.  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  An After Visit Summary was printed and given to the patient.  FOLLOW UP: Return in about 1 month (around 08/03/2017) for f/u anx/dep.  Signed:  Crissie Sickles, MD           07/06/2017

## 2017-07-28 ENCOUNTER — Other Ambulatory Visit: Payer: Self-pay | Admitting: Family Medicine

## 2017-08-03 ENCOUNTER — Ambulatory Visit: Payer: 59 | Admitting: Family Medicine

## 2017-08-03 ENCOUNTER — Encounter: Payer: Self-pay | Admitting: Family Medicine

## 2017-08-03 VITALS — BP 142/68 | HR 74 | Temp 98.9°F | Resp 16 | Ht 71.0 in | Wt 187.0 lb

## 2017-08-03 DIAGNOSIS — E663 Overweight: Secondary | ICD-10-CM

## 2017-08-03 DIAGNOSIS — F3340 Major depressive disorder, recurrent, in remission, unspecified: Secondary | ICD-10-CM | POA: Diagnosis not present

## 2017-08-03 DIAGNOSIS — F411 Generalized anxiety disorder: Secondary | ICD-10-CM

## 2017-08-03 NOTE — Progress Notes (Signed)
OFFICE VISIT  08/03/2017   CC:  Chief Complaint  Patient presents with  . Follow-up    MDD   HPI:    Patient is a 47 y.o. Caucasian male who presents for 1 mo f/u recurrent MDD and GAD with agoraphobia. Restarted him on cymbalta 30mg  qd and lorazepam 0.5 (1-2 bid prn) last visit.  Has been doing well for the last couple weeks---feels 90% improved.  No signif side effects on med.   No changes with his job. He is not self medicating with alcohol anymore. No needing lorazepam.  Has only taken this 1-2 times.    Past Medical History:  Diagnosis Date  . Abnormal weight loss 2016   CXR normal.  Blood w/u normal except mildly elevated lipase, CT abd/pelv w/contrast normal.  . Depression   . Family history of breast cancer   . Family history of colon cancer   . GERD (gastroesophageal reflux disease)   . Heart murmur   . History of prostatitis   . Left groin pain 2012   [facility] eval was reassuring; pt chose no further w/u (with surgeon or neurologist); ibuprofen was recommended.  . Nasal septal granuloma 2017   Cauterized by Dr. Erik Obey  . Panic attacks    in the past  . Radiculopathy of arm 2013   Neuro (Dr. Jacelyn Grip) eval; MRI C-spine did not explain the pain, NCS/EMG planned but never done.  . Seasonal allergic rhinitis     Past Surgical History:  Procedure Laterality Date  . COLONOSCOPY W/ POLYPECTOMY  01/20/15   3 polyps--at least one adenomatous: recall 5 yrs.  No sign of IBD.   [facility] Kitchen ESOPHAGOGASTRODUODENOSCOPY  01/20/15   mild chronic gastritis.  H pylori neg.  Bx's neg.  . INGUINAL HERNIA REPAIR     left    Outpatient Medications Prior to Visit  Medication Sig Dispense Refill  . DULoxetine (CYMBALTA) 30 MG capsule TAKE 1 CAPSULE BY MOUTH EVERY DAY 90 capsule 1  . LORazepam (ATIVAN) 0.5 MG tablet 1-2 tabs po bid prn severe anxiety 30 tablet 1  . Multiple Vitamin (MULTIVITAMIN) tablet Take 1 tablet by mouth daily.    . Omega-3 Fatty Acids (FISH OIL) 1000 MG CAPS Take  1,000 mg by mouth daily.      No facility-administered medications prior to visit.     No Known Allergies  ROS As per HPI  PE: Blood pressure (!) 142/68, pulse 74, temperature 98.9 F (37.2 C), temperature source Oral, resp. rate 16, height 5\' 11"  (1.803 m), weight 187 lb (84.8 kg), SpO2 97 %. Gen: Alert, well appearing.  Patient is oriented to person, place, time, and situation. AFFECT: pleasant, lucid thought and speech. No further exam today.  LABS:  none  IMPRESSION AND PLAN:  MDD, recurrent, mild episode.  In remission. GAD: MUCH improved/remission.   Plan is to continue cymbalta 30mg  qd for at least 1 yr, likely longer.  He was on it 2 years in the past and after he stopped it his depression and anxiety took about 2 yrs to return to a significant level.  An After Visit Summary was printed and given to the patient.  FOLLOW UP: Return in about 6 months (around 02/03/2018) for for either fasting CPE or f/u dep/anxiety.  Signed:  Crissie Sickles, MD           08/03/2017

## 2017-10-26 ENCOUNTER — Other Ambulatory Visit: Payer: Self-pay | Admitting: Family Medicine

## 2017-11-01 ENCOUNTER — Other Ambulatory Visit: Payer: Self-pay | Admitting: Family Medicine

## 2018-01-18 ENCOUNTER — Ambulatory Visit: Payer: 59 | Admitting: Family Medicine

## 2018-01-18 ENCOUNTER — Encounter: Payer: Self-pay | Admitting: Family Medicine

## 2018-01-18 VITALS — BP 121/82 | HR 75 | Resp 16 | Ht 71.0 in | Wt 191.0 lb

## 2018-01-18 DIAGNOSIS — F411 Generalized anxiety disorder: Secondary | ICD-10-CM

## 2018-01-18 DIAGNOSIS — F3342 Major depressive disorder, recurrent, in full remission: Secondary | ICD-10-CM | POA: Diagnosis not present

## 2018-01-18 MED ORDER — LORAZEPAM 0.5 MG PO TABS: ORAL_TABLET | ORAL | 3 refills | Status: DC

## 2018-01-18 MED ORDER — DULOXETINE HCL 30 MG PO CPEP: ORAL_CAPSULE | ORAL | 1 refills | Status: DC

## 2018-01-18 NOTE — Progress Notes (Signed)
OFFICE VISIT  01/18/2018   CC:  Chief Complaint  Patient presents with  . Follow-up    RCI    HPI:    Patient is a 47 y.o. Caucasian male who presents for 6 mo f/u MDD and GAD. He has been back on his cymbalta 30mg  for the last 7 mo now.  He is doing well, mood good and anxiety level not too bad.  Work is his biggest source of stress or poor mood. Family relationships going well.  Appetite and energy level good. No hopelessness, no anhedonia, no sI or HI.  He occasionally has some trouble sleeping since getting on cymbalta. He uses his lorazepam pretty infrequently--maybe 1-2 times per week, some periods of more use at times. He is not using it to help sleep.  He takes 1 tab at a time usually.    Past Medical History:  Diagnosis Date  . Abnormal weight loss 2016   CXR normal.  Blood w/u normal except mildly elevated lipase, CT abd/pelv w/contrast normal.  . Depression   . Family history of breast cancer   . Family history of colon cancer   . GERD (gastroesophageal reflux disease)   . Heart murmur   . History of prostatitis   . Left groin pain 2012   [facility] eval was reassuring; pt chose no further w/u (with surgeon or neurologist); ibuprofen was recommended.  . Nasal septal granuloma 2017   Cauterized by Dr. Erik Obey  . Panic attacks    in the past  . Radiculopathy of arm 2013   Neuro (Dr. Jacelyn Grip) eval; MRI C-spine did not explain the pain, NCS/EMG planned but never done.  . Seasonal allergic rhinitis     Past Surgical History:  Procedure Laterality Date  . COLONOSCOPY W/ POLYPECTOMY  01/20/15   3 polyps--at least one adenomatous: recall 5 yrs.  No sign of IBD.   Marland Kitchen ESOPHAGOGASTRODUODENOSCOPY  01/20/15   mild chronic gastritis.  H pylori neg.  Bx's neg.  . INGUINAL HERNIA REPAIR     left    Outpatient Medications Prior to Visit  Medication Sig Dispense Refill  . Multiple Vitamin (MULTIVITAMIN) tablet Take 1 tablet by mouth daily.    . Omega-3 Fatty Acids  (FISH OIL) 1000 MG CAPS Take 1,000 mg by mouth daily.     . DULoxetine (CYMBALTA) 30 MG capsule TAKE 1 CAPSULE BY MOUTH EVERY DAY 90 capsule 1  . LORazepam (ATIVAN) 0.5 MG tablet 1-2 tabs po bid prn severe anxiety 30 tablet 1   No facility-administered medications prior to visit.     No Known Allergies  ROS As per HPI  PE: Blood pressure 121/82, pulse 75, resp. rate 16, height 5\' 11"  (1.803 m), weight 191 lb (86.6 kg), SpO2 98 %. Wt Readings from Last 2 Encounters:  01/18/18 191 lb (86.6 kg)  08/03/17 187 lb (84.8 kg)    Gen: alert, oriented x 4, affect pleasant.  Lucid thinking and conversation noted. HEENT: PERRLA, EOMI.   Neck: no LAD, mass, or thyromegaly. CV: RRR, no m/r/g LUNGS: CTA bilat, nonlabored. NEURO: no tremor or tics noted on observation.  Coordination intact. CN 2-12 grossly intact bilaterally, strength 5/5 in all extremeties.  No ataxia.   LABS:    Chemistry      Component Value Date/Time   NA 139 09/11/2014 1010   K 5.2 (H) 09/11/2014 1010   CL 101 09/11/2014 1010   CO2 30 09/11/2014 1010   BUN 23 09/11/2014 1010  CREATININE 1.06 09/11/2014 1010      Component Value Date/Time   CALCIUM 10.0 09/11/2014 1010   ALKPHOS 57 09/11/2014 1010   AST 15 09/11/2014 1010   ALT 16 09/11/2014 1010   BILITOT 0.9 09/11/2014 1010      IMPRESSION AND PLAN:  MDD, recurrent, in remission. GAD--doing fine.  Will continue with current med regimen. He will try to exercise more for stress reduction/mood enhancer. Controlled substance contract reviewed with patient today.  Patient signed this and it will be placed in the chart.   Rx's for lorazepam and duloxetine eRx'd today.  An After Visit Summary was printed and given to the patient.  FOLLOW UP: Return in about 6 months (around 07/19/2018) for annual CPE (fasting).  Signed:  Crissie Sickles, MD           01/18/2018

## 2018-06-28 ENCOUNTER — Other Ambulatory Visit: Payer: Self-pay | Admitting: Family Medicine

## 2018-07-01 NOTE — Telephone Encounter (Signed)
Pt is due for F/U appt. Pt was sent my chart message letting him know to call and schedule virtual visit to ensure getting refills when needed. Pt should have about three weeks of medication left.

## 2018-07-04 ENCOUNTER — Encounter: Payer: Self-pay | Admitting: Family Medicine

## 2018-07-04 ENCOUNTER — Ambulatory Visit (INDEPENDENT_AMBULATORY_CARE_PROVIDER_SITE_OTHER): Payer: 59 | Admitting: Family Medicine

## 2018-07-04 ENCOUNTER — Other Ambulatory Visit: Payer: Self-pay

## 2018-07-04 DIAGNOSIS — F3342 Major depressive disorder, recurrent, in full remission: Secondary | ICD-10-CM

## 2018-07-04 DIAGNOSIS — F411 Generalized anxiety disorder: Secondary | ICD-10-CM | POA: Diagnosis not present

## 2018-07-04 NOTE — Progress Notes (Signed)
Virtual Visit via Video Note  I connected with Anthony Gilmore  on 07/04/18 at  9:00 AM EDT by a video enabled telemedicine application and verified that I am speaking with the correct person using two identifiers.  Location patient: home Location provider:work or home office Persons participating in the virtual visit: patient, provider  I discussed the limitations of evaluation and management by telemedicine and the availability of in person appointments. The patient expressed understanding and agreed to proceed.  Telemedicine visit is a necessity given the COVID-19 restrictions in place at the current time.  HPI: 48 y/o WM being seen today for 6 mo f/u GAD and MDD. Last visit all was stable and he remained on the same med regimen. This was to be a CPE visit but due to COVID-19 we have pushed his CPE off for now.  Interim hx:  Doing well.  No problems or questions today. Compliant with duloxetine 30mg  qd.  Anxiety level stable, handling things well. No depressed mood.  Sleep and appetite are fine.  No significant irritability, no panic. Concentration is fine.  All relationships going fine.  He is no longer wanting to continue lorazepam use at all.     ROS: See pertinent positives and negatives per HPI.  Past Medical History:  Diagnosis Date  . Abnormal weight loss 2016   CXR normal.  Blood w/u normal except mildly elevated lipase, CT abd/pelv w/contrast normal.  . Depression   . Family history of breast cancer   . Family history of colon cancer   . GERD (gastroesophageal reflux disease)   . Heart murmur   . History of prostatitis   . Left groin pain 2012   [facility] eval was reassuring; Anthony Gilmore chose no further w/u (with surgeon or neurologist); ibuprofen was recommended.  . Nasal septal granuloma 2017   Cauterized by Dr. Erik Obey  . Panic attacks    in the past  . Radiculopathy of arm 2013   Neuro (Dr. Jacelyn Grip) eval; MRI C-spine did not explain the pain, NCS/EMG planned but never done.   . Seasonal allergic rhinitis     Past Surgical History:  Procedure Laterality Date  . COLONOSCOPY W/ POLYPECTOMY  01/20/15   3 polyps--at least one adenomatous: recall 5 yrs.  No sign of IBD.   Marland Kitchen ESOPHAGOGASTRODUODENOSCOPY  01/20/15   mild chronic gastritis.  H pylori neg.  Bx's neg.  . INGUINAL HERNIA REPAIR     left    Family History  Problem Relation Age of Onset  . Breast cancer Mother   . Colon cancer Mother   . Lung cancer Mother   . Breast cancer Sister 62  . Ulcerative colitis Father      Current Outpatient Medications:  .  DULoxetine (CYMBALTA) 30 MG capsule, TAKE 1 CAPSULE BY MOUTH EVERY DAY, Disp: 90 capsule, Rfl: 1 .  Multiple Vitamin (MULTIVITAMIN) tablet, Take 1 tablet by mouth daily., Disp: , Rfl:  .  Omega-3 Fatty Acids (FISH OIL) 1000 MG CAPS, Take 1,000 mg by mouth daily. , Disp: , Rfl:  .  LORazepam (ATIVAN) 0.5 MG tablet, 1-2 tabs po bid prn severe anxiety (Patient not taking: Reported on 07/04/2018), Disp: 60 tablet, Rfl: 3  EXAM:  VITALS per patient if applicable: There were no vitals taken for this visit.   GENERAL: alert, oriented, appears well and in no acute distress  HEENT: atraumatic, conjunttiva clear, no obvious abnormalities on inspection of external nose and ears  NECK: normal movements of the head and  neck  LUNGS: on inspection no signs of respiratory distress, breathing rate appears normal, no obvious gross SOB, gasping or wheezing  CV: no obvious cyanosis  MS: moves all visible extremities without noticeable abnormality  PSYCH/NEURO: pleasant and cooperative, no obvious depression or anxiety, speech and thought processing grossly intact  LABS: none today    Chemistry      Component Value Date/Time   NA 139 09/11/2014 1010   K 5.2 (H) 09/11/2014 1010   CL 101 09/11/2014 1010   CO2 30 09/11/2014 1010   BUN 23 09/11/2014 1010   CREATININE 1.06 09/11/2014 1010      Component Value Date/Time   CALCIUM 10.0 09/11/2014 1010    ALKPHOS 57 09/11/2014 1010   AST 15 09/11/2014 1010   ALT 16 09/11/2014 1010   BILITOT 0.9 09/11/2014 1010     Lab Results  Component Value Date   WBC 4.9 09/11/2014   HGB 15.8 09/11/2014   HCT 46.8 09/11/2014   MCV 91.7 09/11/2014   PLT 161.0 09/11/2014   ASSESSMENT AND PLAN:  Discussed the following assessment and plan:  1) GAD (w/hx of panic attacks) and hx of MDD--in remission. He is doing very well. Plan is to continue OFF of lorazepam and stay on cymbalta 30 mg qd. He does want to consider possibly coming off the cymbalta at next f/u in 6 mo.  I discussed the assessment and treatment plan with the patient. The patient was provided an opportunity to ask questions and all were answered. The patient agreed with the plan and demonstrated an understanding of the instructions.   The patient was advised to call back or seek an in-person evaluation if the symptoms worsen or if the condition fails to improve as anticipated.  F/u: 6 mo CPE  Signed:  Crissie Sickles, MD           07/04/2018

## 2018-07-11 ENCOUNTER — Other Ambulatory Visit: Payer: Self-pay | Admitting: Family Medicine

## 2018-12-18 ENCOUNTER — Other Ambulatory Visit: Payer: Self-pay

## 2018-12-18 MED ORDER — DULOXETINE HCL 30 MG PO CPEP: 30.0000 mg | ORAL_CAPSULE | Freq: Every day | ORAL | 0 refills | Status: DC

## 2018-12-27 ENCOUNTER — Ambulatory Visit: Payer: 59 | Admitting: Family Medicine

## 2018-12-27 ENCOUNTER — Other Ambulatory Visit: Payer: Self-pay

## 2018-12-27 ENCOUNTER — Encounter: Payer: Self-pay | Admitting: Family Medicine

## 2018-12-27 VITALS — BP 120/80 | HR 56 | Temp 98.2°F | Resp 16 | Ht 71.0 in | Wt 192.0 lb

## 2018-12-27 DIAGNOSIS — F3342 Major depressive disorder, recurrent, in full remission: Secondary | ICD-10-CM | POA: Diagnosis not present

## 2018-12-27 DIAGNOSIS — F411 Generalized anxiety disorder: Secondary | ICD-10-CM

## 2018-12-27 MED ORDER — DULOXETINE HCL 30 MG PO CPEP: 30.0000 mg | ORAL_CAPSULE | Freq: Every day | ORAL | 3 refills | Status: DC

## 2018-12-27 NOTE — Progress Notes (Signed)
OFFICE VISIT  12/27/2018   CC:  Chief Complaint  Patient presents with  . Follow-up    GAD, MDD     HPI:    Patient is a 48 y.o. Caucasian male who presents for 6 mo f/u MDD and GAD. A/P for this problem as of last f/u was: "GAD (w/hx of panic attacks) and hx of MDD--in remission. He is doing very well. Plan is to continue OFF of lorazepam and stay on cymbalta 30 mg qd. He does want to consider possibly coming off the cymbalta at next f/u in 6 mo".  INTERIM HX:  Doing fine. Exercising well: running 5-7 days a week.  Eating healthy.  All stable emotionally and from anxiety standpoint.  No panic.  No persistent depressed moods. Working from home 70% of the time. Sleeping well.  Work and family are good: 2 teens trying to adjust to home school. He no longer uses lorazepam at all.  Past Medical History:  Diagnosis Date  . Abnormal weight loss 2016   CXR normal.  Blood w/u normal except mildly elevated lipase, CT abd/pelv w/contrast normal.  . Depression   . Family history of breast cancer   . Family history of colon cancer   . GERD (gastroesophageal reflux disease)   . Heart murmur   . History of prostatitis   . Left groin pain 2012   [facility] eval was reassuring; pt chose no further w/u (with surgeon or neurologist); ibuprofen was recommended.  . Nasal septal granuloma 2017   Cauterized by Dr. Erik Obey  . Panic attacks    in the past  . Radiculopathy of arm 2013   Neuro (Dr. Jacelyn Grip) eval; MRI C-spine did not explain the pain, NCS/EMG planned but never done.  . Seasonal allergic rhinitis     Past Surgical History:  Procedure Laterality Date  . COLONOSCOPY W/ POLYPECTOMY  01/20/15   3 polyps--at least one adenomatous: recall 5 yrs.  No sign of IBD.   Marland Kitchen ESOPHAGOGASTRODUODENOSCOPY  01/20/15   mild chronic gastritis.  H pylori neg.  Bx's neg.  . INGUINAL HERNIA REPAIR     left   Social History   Socioeconomic History  . Marital status: Married    Spouse name:  Not on file  . Number of children: 2  . Years of education: Not on file  . Highest education level: Not on file  Occupational History  . Not on file  Social Needs  . Financial resource strain: Not on file  . Food insecurity    Worry: Not on file    Inability: Not on file  . Transportation needs    Medical: Not on file    Non-medical: Not on file  Tobacco Use  . Smoking status: Former Smoker    Packs/day: 1.50    Years: 11.00    Pack years: 16.50    Types: Cigarettes, E-cigarettes  . Smokeless tobacco: Never Used  . Tobacco comment: occ will test products at work  Substance and Sexual Activity  . Alcohol use: Yes    Alcohol/week: 5.0 standard drinks    Types: 5 Standard drinks or equivalent per week  . Drug use: No  . Sexual activity: Not on file  Lifestyle  . Physical activity    Days per week: Not on file    Minutes per session: Not on file  . Stress: Not on file  Relationships  . Social connections    Talks on phone: Not on file  Gets together: Not on file    Attends religious service: Not on file    Active member of club or organization: Not on file    Attends meetings of clubs or organizations: Not on file    Relationship status: Not on file  Other Topics Concern  . Not on file  Social History Narrative   Married, 2 children (ages 65-boy, age 55 girl).   Relocated from [place] to [facility] about 2011.   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   Likes soccer, brews beer, music.   Sporadic exercise: wt lifting, running, squash.   Tob 20 pack-yr hx; quit around age 16.     Alcohol: couple beers several days a week.   No drug use/abuse.          Outpatient Medications Prior to Visit  Medication Sig Dispense Refill  . DULoxetine (CYMBALTA) 30 MG capsule Take 1 capsule (30 mg total) by mouth daily. 30 capsule 0  . LORazepam (ATIVAN) 0.5 MG tablet 1-2 tabs po bid prn severe anxiety (Patient not taking: Reported on 07/04/2018) 60 tablet 3  .  Multiple Vitamin (MULTIVITAMIN) tablet Take 1 tablet by mouth daily.    . Omega-3 Fatty Acids (FISH OIL) 1000 MG CAPS Take 1,000 mg by mouth daily.      No facility-administered medications prior to visit.     No Known Allergies  ROS As per HPI  PE: Blood pressure 120/80, pulse (!) 56, temperature 98.2 F (36.8 C), temperature source Temporal, resp. rate 16, height 5\' 11"  (1.803 m), weight 192 lb (87.1 kg), SpO2 97 %. Body mass index is 26.78 kg/m. Body mass index is 26.78 kg/m.   Gen: Alert, well appearing.  Patient is oriented to person, place, time, and situation. AFFECT: pleasant, lucid thought and speech. No further exam today.  LABS:    Chemistry      Component Value Date/Time   NA 139 09/11/2014 1010   K 5.2 (H) 09/11/2014 1010   CL 101 09/11/2014 1010   CO2 30 09/11/2014 1010   BUN 23 09/11/2014 1010   CREATININE 1.06 09/11/2014 1010      Component Value Date/Time   CALCIUM 10.0 09/11/2014 1010   ALKPHOS 57 09/11/2014 1010   AST 15 09/11/2014 1010   ALT 16 09/11/2014 1010   BILITOT 0.9 09/11/2014 1010     Lab Results  Component Value Date   CHOL 227 (H) 03/09/2015   HDL 64.40 03/09/2015   LDLCALC 148 (H) 03/09/2015   TRIG 71.0 03/09/2015   CHOLHDL 4 03/09/2015    IMPRESSION AND PLAN:  GAD and MDD that is in full remission. All is stable.  He has tended to have recurrence of his problems if he gets off his med so he has decided to just stay on cymbalta at current dose and we'll periodically re-evaluate the possibility of weening off. Will take lorazepam off of his current med list since he no longer uses it. Doing great with exercise and diet.  An After Visit Summary was printed and given to the patient.  FOLLOW UP: Return in about 6 months (around 06/27/2019) for annual CPE (fasting).  Signed:  Crissie Sickles, MD           12/27/2018

## 2019-03-14 DIAGNOSIS — E785 Hyperlipidemia, unspecified: Secondary | ICD-10-CM

## 2019-03-14 HISTORY — DX: Hyperlipidemia, unspecified: E78.5

## 2019-06-30 ENCOUNTER — Other Ambulatory Visit: Payer: Self-pay

## 2019-07-01 ENCOUNTER — Encounter: Payer: Self-pay | Admitting: Family Medicine

## 2019-07-01 ENCOUNTER — Ambulatory Visit: Payer: 59 | Admitting: Family Medicine

## 2019-07-01 VITALS — BP 138/86 | HR 72 | Temp 98.3°F | Resp 16 | Ht 71.0 in | Wt 193.6 lb

## 2019-07-01 DIAGNOSIS — Z0000 Encounter for general adult medical examination without abnormal findings: Secondary | ICD-10-CM

## 2019-07-01 LAB — CBC WITH DIFFERENTIAL/PLATELET
Basophils Absolute: 0 10*3/uL (ref 0.0–0.1)
Basophils Relative: 1 % (ref 0.0–3.0)
Eosinophils Absolute: 0.1 10*3/uL (ref 0.0–0.7)
Eosinophils Relative: 2.4 % (ref 0.0–5.0)
HCT: 44.3 % (ref 39.0–52.0)
Hemoglobin: 15.3 g/dL (ref 13.0–17.0)
Lymphocytes Relative: 33.2 % (ref 12.0–46.0)
Lymphs Abs: 1.1 10*3/uL (ref 0.7–4.0)
MCHC: 34.7 g/dL (ref 30.0–36.0)
MCV: 94.5 fl (ref 78.0–100.0)
Monocytes Absolute: 0.3 10*3/uL (ref 0.1–1.0)
Monocytes Relative: 9.8 % (ref 3.0–12.0)
Neutro Abs: 1.8 10*3/uL (ref 1.4–7.7)
Neutrophils Relative %: 53.6 % (ref 43.0–77.0)
Platelets: 149 10*3/uL — ABNORMAL LOW (ref 150.0–400.0)
RBC: 4.69 Mil/uL (ref 4.22–5.81)
RDW: 13 % (ref 11.5–15.5)
WBC: 3.4 10*3/uL — ABNORMAL LOW (ref 4.0–10.5)

## 2019-07-01 LAB — COMPREHENSIVE METABOLIC PANEL
ALT: 16 U/L (ref 0–53)
AST: 19 U/L (ref 0–37)
Albumin: 4.3 g/dL (ref 3.5–5.2)
Alkaline Phosphatase: 54 U/L (ref 39–117)
BUN: 14 mg/dL (ref 6–23)
CO2: 28 mEq/L (ref 19–32)
Calcium: 9.4 mg/dL (ref 8.4–10.5)
Chloride: 103 mEq/L (ref 96–112)
Creatinine, Ser: 0.9 mg/dL (ref 0.40–1.50)
GFR: 90.31 mL/min (ref >60.00)
Glucose, Bld: 109 mg/dL — ABNORMAL HIGH (ref 70–99)
Potassium: 4.9 mEq/L (ref 3.5–5.1)
Sodium: 139 mEq/L (ref 135–145)
Total Bilirubin: 1 mg/dL (ref 0.2–1.2)
Total Protein: 6.5 g/dL (ref 6.0–8.3)

## 2019-07-01 LAB — LIPID PANEL
Cholesterol: 226 mg/dL — ABNORMAL HIGH (ref 0–200)
HDL: 44.2 mg/dL (ref >39.00)
LDL Cholesterol: 144 mg/dL — ABNORMAL HIGH (ref 0–99)
NonHDL: 182.13
Total CHOL/HDL Ratio: 5
Triglycerides: 192 mg/dL — ABNORMAL HIGH (ref 0.0–149.0)
VLDL: 38.4 mg/dL (ref 0.0–40.0)

## 2019-07-01 LAB — TSH: TSH: 2.51 u[IU]/mL (ref 0.35–4.50)

## 2019-07-01 NOTE — Patient Instructions (Signed)

## 2019-07-01 NOTE — Progress Notes (Signed)
Office Note 07/01/2019  CC:  Chief Complaint  Patient presents with  . Annual Exam    pt is fasting   HPI:  Anthony Gilmore is a 49 y.o. White male who is here for annual health maintenance exam. Recent LB strain working in the yard but getting better. Some regular exercise. Diet is fairly healthy.  Past Medical History:  Diagnosis Date  . Abnormal weight loss 2016   CXR normal.  Blood w/u normal except mildly elevated lipase, CT abd/pelv w/contrast normal.  . Depression   . Family history of breast cancer   . Family history of colon cancer   . GERD (gastroesophageal reflux disease)   . Heart murmur   . History of prostatitis   . Left groin pain 2012   [facility] eval was reassuring; pt chose no further w/u (with surgeon or neurologist); ibuprofen was recommended.  . Nasal septal granuloma 2017   Cauterized by Dr. Erik Obey  . Panic attacks    in the past  . Radiculopathy of arm 2013   Neuro (Dr. Jacelyn Grip) eval; MRI C-spine did not explain the pain, NCS/EMG planned but never done.  . Seasonal allergic rhinitis     Past Surgical History:  Procedure Laterality Date  . COLONOSCOPY W/ POLYPECTOMY  01/20/15   3 polyps--at least one adenomatous: recall 5 yrs.  No sign of IBD.   Marland Kitchen ESOPHAGOGASTRODUODENOSCOPY  01/20/15   mild chronic gastritis.  H pylori neg.  Bx's neg.  . INGUINAL HERNIA REPAIR     left    Family History  Problem Relation Age of Onset  . Breast cancer Mother   . Colon cancer Mother   . Lung cancer Mother   . Breast cancer Sister 42  . Ulcerative colitis Father     Social History   Socioeconomic History  . Marital status: Married    Spouse name: Not on file  . Number of children: 2  . Years of education: Not on file  . Highest education level: Not on file  Occupational History  . Not on file  Tobacco Use  . Smoking status: Former Smoker    Packs/day: 1.50    Years: 11.00    Pack years: 16.50    Types: Cigarettes, E-cigarettes  . Smokeless  tobacco: Never Used  . Tobacco comment: occ will test products at work  Substance and Sexual Activity  . Alcohol use: Yes    Alcohol/week: 5.0 standard drinks    Types: 5 Standard drinks or equivalent per week  . Drug use: No  . Sexual activity: Not on file  Other Topics Concern  . Not on file  Social History Narrative   Married, 2 children (ages 50-boy, age 35 girl).   Relocated from [place] to [facility] about 2011.   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   Likes soccer, brews beer, music.   Sporadic exercise: wt lifting, running, squash.   Tob 20 pack-yr hx; quit around age 45.     Alcohol: couple beers several days a week.   No drug use/abuse.         Social Determinants of Health   Financial Resource Strain:   . Difficulty of Paying Living Expenses:   Food Insecurity:   . Worried About Charity fundraiser in the Last Year:   . Arboriculturist in the Last Year:   Transportation Needs:   . Film/video editor (Medical):   Marland Kitchen Lack of Transportation (Non-Medical):  Physical Activity:   . Days of Exercise per Week:   . Minutes of Exercise per Session:   Stress:   . Feeling of Stress :   Social Connections:   . Frequency of Communication with Friends and Family:   . Frequency of Social Gatherings with Friends and Family:   . Attends Religious Services:   . Active Member of Clubs or Organizations:   . Attends Archivist Meetings:   Marland Kitchen Marital Status:   Intimate Partner Violence:   . Fear of Current or Ex-Partner:   . Emotionally Abused:   Marland Kitchen Physically Abused:   . Sexually Abused:     Outpatient Medications Prior to Visit  Medication Sig Dispense Refill  . DULoxetine (CYMBALTA) 30 MG capsule Take 1 capsule (30 mg total) by mouth daily. 90 capsule 3   No facility-administered medications prior to visit.    No Known Allergies  ROS Review of Systems  Constitutional: Negative for appetite change, chills, fatigue and fever.  HENT:  Negative for congestion, dental problem, ear pain and sore throat.   Eyes: Negative for discharge, redness and visual disturbance.  Respiratory: Negative for cough, chest tightness, shortness of breath and wheezing.   Cardiovascular: Negative for chest pain, palpitations and leg swelling.  Gastrointestinal: Negative for abdominal pain, blood in stool, diarrhea, nausea and vomiting.  Genitourinary: Negative for difficulty urinating, dysuria, flank pain, frequency, hematuria and urgency.  Musculoskeletal: Negative for arthralgias, back pain, joint swelling, myalgias and neck stiffness.  Skin: Negative for pallor and rash.  Neurological: Negative for dizziness, speech difficulty, weakness and headaches.  Hematological: Negative for adenopathy. Does not bruise/bleed easily.  Psychiatric/Behavioral: Negative for confusion and sleep disturbance. The patient is not nervous/anxious.     PE; Blood pressure 138/86, pulse 72, temperature 98.3 F (36.8 C), temperature source Temporal, resp. rate 16, height 5\' 11"  (1.803 m), weight 193 lb 9.6 oz (87.8 kg), SpO2 98 %. Body mass index is 27 kg/m.  Gen: Alert, well appearing.  Patient is oriented to person, place, time, and situation. AFFECT: pleasant, lucid thought and speech. ENT: Ears: EACs clear, normal epithelium.  TMs with good light reflex and landmarks bilaterally.  Eyes: no injection, icteris, swelling, or exudate.  EOMI, PERRLA. Nose: no drainage or turbinate edema/swelling.  No injection or focal lesion.  Mouth: lips without lesion/swelling.  Oral mucosa pink and moist.  Dentition intact and without obvious caries or gingival swelling.  Oropharynx without erythema, exudate, or swelling.  Neck: supple/nontender.  No LAD, mass, or TM.  Carotid pulses 2+ bilaterally, without bruits. CV: RRR, no m/r/g.   LUNGS: CTA bilat, nonlabored resps, good aeration in all lung fields. ABD: soft, NT, ND, BS normal.  No hepatospenomegaly or mass.  No bruits. EXT:  no clubbing, cyanosis, or edema.  Musculoskeletal: no joint swelling, erythema, warmth, or tenderness.  ROM of all joints intact. Skin - no sores or suspicious lesions or rashes or color changes   Pertinent labs:  Lab Results  Component Value Date   TSH 1.02 07/31/2014   Lab Results  Component Value Date   WBC 4.9 09/11/2014   HGB 15.8 09/11/2014   HCT 46.8 09/11/2014   MCV 91.7 09/11/2014   PLT 161.0 09/11/2014   Lab Results  Component Value Date   CREATININE 1.06 09/11/2014   BUN 23 09/11/2014   NA 139 09/11/2014   K 5.2 (H) 09/11/2014   CL 101 09/11/2014   CO2 30 09/11/2014   Lab Results  Component Value  Date   ALT 16 09/11/2014   AST 15 09/11/2014   ALKPHOS 57 09/11/2014   BILITOT 0.9 09/11/2014   Lab Results  Component Value Date   CHOL 227 (H) 03/09/2015   Lab Results  Component Value Date   HDL 64.40 03/09/2015   Lab Results  Component Value Date   LDLCALC 148 (H) 03/09/2015   Lab Results  Component Value Date   TRIG 71.0 03/09/2015   Lab Results  Component Value Date   CHOLHDL 4 03/09/2015   Lab Results  Component Value Date   PSA 0.71 07/31/2014    ASSESSMENT AND PLAN:   Health maintenance exam: Reviewed age and gender appropriate health maintenance issues (prudent diet, regular exercise, health risks of tobacco and excessive alcohol, use of seatbelts, fire alarms in home, use of sunscreen).  Also reviewed age and gender appropriate health screening as well as vaccine recommendations. Vaccines: Tdap UTD as is covid 19. Labs: fasting HP labs. Prostate ca screening: average risk patient= as per latest guidelines, start screening at 44 yrs of age. Colon ca screening: hx of polyps->recall 01/2020.  If cholesterol comes back elevated he strongly prefers TLC over meds.  Anxiety: doing well long term on cymbalta and wants to stay on this indefinitely. He'll let me know if any problems but plan on annual physical f/u's and we'll touch base about  this med/anxiety on annual basis.  An After Visit Summary was printed and given to the patient.  FOLLOW UP:  Return for annual CPE (fasting).  Signed:  Crissie Sickles, MD           07/01/2019

## 2019-07-02 ENCOUNTER — Encounter: Payer: Self-pay | Admitting: Family Medicine

## 2019-07-02 ENCOUNTER — Other Ambulatory Visit (INDEPENDENT_AMBULATORY_CARE_PROVIDER_SITE_OTHER): Payer: 59

## 2019-07-02 DIAGNOSIS — R7301 Impaired fasting glucose: Secondary | ICD-10-CM

## 2019-07-02 LAB — HEMOGLOBIN A1C: Hgb A1c MFr Bld: 5 % (ref 4.6–6.5)

## 2019-09-17 ENCOUNTER — Other Ambulatory Visit: Payer: Self-pay | Admitting: Family Medicine

## 2020-03-15 ENCOUNTER — Other Ambulatory Visit: Payer: Self-pay | Admitting: Family Medicine

## 2020-04-27 ENCOUNTER — Encounter: Payer: Self-pay | Admitting: Internal Medicine

## 2020-07-01 ENCOUNTER — Encounter: Payer: 59 | Admitting: Family Medicine

## 2020-07-15 NOTE — Progress Notes (Signed)
Office Note 07/16/2020  CC:  Chief Complaint  Patient presents with  . Annual Exam    fasting    HPI:  Anthony Gilmore is a 50 y.o. White male who is here for annual health maintenance exam. A/P as of last visit: "Health maintenance exam: Reviewed age and gender appropriate health maintenance issues (prudent diet, regular exercise, health risks of tobacco and excessive alcohol, use of seatbelts, fire alarms in home, use of sunscreen).  Also reviewed age and gender appropriate health screening as well as vaccine recommendations. Vaccines: Tdap UTD as is covid 19. Labs: fasting HP labs. Prostate ca screening: average risk patient= as per latest guidelines, start screening at 80 yrs of age. Colon ca screening: hx of polyps->recall 01/2020."  INTERIM HX: Doing fine. Running and cycling in cycles--some months good and some not. Diet: healthy.  Mood and anxiety stable on cymbalta 30mg  qd.    Past Medical History:  Diagnosis Date  . Abnormal weight loss 2016   CXR normal.  Blood w/u normal except mildly elevated lipase, CT abd/pelv w/contrast normal.  . Borderline hyperlipidemia 2021  . Depression   . Family history of breast cancer   . Family history of colon cancer   . GERD (gastroesophageal reflux disease)   . Heart murmur   . History of prostatitis   . Left groin pain 2012   [facility] eval was reassuring; pt chose no further w/u (with surgeon or neurologist); ibuprofen was recommended.  . Nasal septal granuloma 2017   Cauterized by Dr. Erik Obey  . Panic attacks    in the past  . Radiculopathy of arm 2013   Neuro (Dr. Jacelyn Grip) eval; MRI C-spine did not explain the pain, NCS/EMG planned but never done.  . Seasonal allergic rhinitis     Past Surgical History:  Procedure Laterality Date  . COLONOSCOPY W/ POLYPECTOMY  01/20/15   3 polyps--at least one adenomatous: recall 5 yrs.  No sign of IBD.   Marland Kitchen ESOPHAGOGASTRODUODENOSCOPY  01/20/15   mild chronic gastritis.  H  pylori neg.  Bx's neg.  . INGUINAL HERNIA REPAIR     left    Family History  Problem Relation Age of Onset  . Breast cancer Mother   . Colon cancer Mother   . Lung cancer Mother   . Breast cancer Sister 39  . Ulcerative colitis Father     Social History   Socioeconomic History  . Marital status: Married    Spouse name: Not on file  . Number of children: 2  . Years of education: Not on file  . Highest education level: Not on file  Occupational History  . Not on file  Tobacco Use  . Smoking status: Former Smoker    Packs/day: 1.50    Years: 11.00    Pack years: 16.50    Types: Cigarettes, E-cigarettes  . Smokeless tobacco: Never Used  . Tobacco comment: occ will test products at work  Substance and Sexual Activity  . Alcohol use: Yes    Alcohol/week: 5.0 standard drinks    Types: 5 Standard drinks or equivalent per week  . Drug use: No  . Sexual activity: Yes  Other Topics Concern  . Not on file  Social History Narrative   Married, 2 children (ages 43-boy, age 73 girl).   Relocated from [place] to [facility] about 2011.   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   Likes soccer, brews beer, music.   Sporadic exercise: wt lifting, running,  squash.   Tob 20 pack-yr hx; quit around age 86.     Alcohol: couple beers several days a week.   No drug use/abuse.         Social Determinants of Health   Financial Resource Strain: Not on file  Food Insecurity: Not on file  Transportation Needs: Not on file  Physical Activity: Not on file  Stress: Not on file  Social Connections: Not on file  Intimate Partner Violence: Not on file    Outpatient Medications Prior to Visit  Medication Sig Dispense Refill  . DULoxetine (CYMBALTA) 30 MG capsule TAKE 1 CAPSULE BY MOUTH EVERY DAY 90 capsule 1  . Multiple Vitamin (MULTIVITAMIN ADULT PO) Take by mouth daily.     No facility-administered medications prior to visit.    No Known Allergies  ROS Review of Systems   Constitutional: Negative for appetite change, chills, fatigue and fever.  HENT: Negative for congestion, dental problem, ear pain and sore throat.   Eyes: Negative for discharge, redness and visual disturbance.  Respiratory: Negative for cough, chest tightness, shortness of breath and wheezing.   Cardiovascular: Negative for chest pain, palpitations and leg swelling.  Gastrointestinal: Negative for abdominal pain, blood in stool, diarrhea, nausea and vomiting.  Genitourinary: Negative for difficulty urinating, dysuria, flank pain, frequency, hematuria and urgency.  Musculoskeletal: Negative for arthralgias, back pain, joint swelling, myalgias and neck stiffness.  Skin: Negative for pallor and rash.  Neurological: Negative for dizziness, speech difficulty, weakness and headaches.  Hematological: Negative for adenopathy. Does not bruise/bleed easily.  Psychiatric/Behavioral: Negative for confusion and sleep disturbance. The patient is not nervous/anxious.     PE; Vitals with BMI 07/16/2020 07/01/2019 12/27/2018  Height 5\' 11"  5\' 11"  5\' 11"   Weight 202 lbs 193 lbs 10 oz 192 lbs  BMI 28.19 85.63 14.97  Systolic 026 378 588  Diastolic 71 86 80  Pulse 74 72 56     Gen: Alert, well appearing.  Patient is oriented to person, place, time, and situation. AFFECT: pleasant, lucid thought and speech. ENT: Ears: EACs clear, normal epithelium.  TMs with good light reflex and landmarks bilaterally.  Eyes: no injection, icteris, swelling, or exudate.  EOMI, PERRLA. Nose: no drainage or turbinate edema/swelling.  No injection or focal lesion.  Mouth: lips without lesion/swelling.  Oral mucosa pink and moist.  Dentition intact and without obvious caries or gingival swelling.  Oropharynx without erythema, exudate, or swelling.  Neck: supple/nontender.  No LAD, mass, or TM.  Carotid pulses 2+ bilaterally, without bruits. CV: RRR, no m/r/g.   LUNGS: CTA bilat, nonlabored resps, good aeration in all lung  fields. ABD: soft, NT, ND, BS normal.  No hepatospenomegaly or mass.  No bruits. EXT: no clubbing, cyanosis, or edema.  Musculoskeletal: no joint swelling, erythema, warmth, or tenderness.  ROM of all joints intact. Skin - no sores or suspicious lesions or rashes or color changes   Pertinent labs:  Lab Results  Component Value Date   TSH 2.51 07/01/2019   Lab Results  Component Value Date   WBC 3.4 (L) 07/01/2019   HGB 15.3 07/01/2019   HCT 44.3 07/01/2019   MCV 94.5 07/01/2019   PLT 149.0 (L) 07/01/2019   Lab Results  Component Value Date   CREATININE 0.90 07/01/2019   BUN 14 07/01/2019   NA 139 07/01/2019   K 4.9 07/01/2019   CL 103 07/01/2019   CO2 28 07/01/2019   Lab Results  Component Value Date   ALT 16  07/01/2019   AST 19 07/01/2019   ALKPHOS 54 07/01/2019   BILITOT 1.0 07/01/2019   Lab Results  Component Value Date   CHOL 226 (H) 07/01/2019   Lab Results  Component Value Date   HDL 44.20 07/01/2019   Lab Results  Component Value Date   LDLCALC 144 (H) 07/01/2019   Lab Results  Component Value Date   TRIG 192.0 (H) 07/01/2019   Lab Results  Component Value Date   CHOLHDL 5 07/01/2019   Lab Results  Component Value Date   PSA 0.71 07/31/2014   Lab Results  Component Value Date   HGBA1C 5.0 07/02/2019   ASSESSMENT AND PLAN:   1) Hx of MDD/GAD: stable on cymbalta 30mg  long term and plans to stay on this indefinitely.  2) Health maintenance exam: Reviewed age and gender appropriate health maintenance issues (prudent diet, regular exercise, health risks of tobacco and excessive alcohol, use of seatbelts, fire alarms in home, use of sunscreen).  Also reviewed age and gender appropriate health screening as well as vaccine recommendations. Vaccines: ALL UTD. Labs: fasting HP labs ordered.  Hep C screening ordered as well. Prostate ca screening: PSA starting at age 59 yrs. Colon ca screening: recall was 01/2020->he has his GI contact info to set  this up.  An After Visit Summary was printed and given to the patient.  FOLLOW UP:  Return in about 1 year (around 07/16/2021) for annual CPE (fasting).  Signed:  Crissie Sickles, MD           07/16/2020

## 2020-07-16 ENCOUNTER — Encounter: Payer: Self-pay | Admitting: Family Medicine

## 2020-07-16 ENCOUNTER — Ambulatory Visit (INDEPENDENT_AMBULATORY_CARE_PROVIDER_SITE_OTHER): Payer: 59 | Admitting: Family Medicine

## 2020-07-16 ENCOUNTER — Other Ambulatory Visit: Payer: Self-pay

## 2020-07-16 VITALS — BP 107/71 | HR 74 | Temp 98.2°F | Resp 16 | Ht 71.0 in | Wt 202.0 lb

## 2020-07-16 DIAGNOSIS — F411 Generalized anxiety disorder: Secondary | ICD-10-CM

## 2020-07-16 DIAGNOSIS — Z0000 Encounter for general adult medical examination without abnormal findings: Secondary | ICD-10-CM | POA: Diagnosis not present

## 2020-07-16 DIAGNOSIS — F339 Major depressive disorder, recurrent, unspecified: Secondary | ICD-10-CM | POA: Diagnosis not present

## 2020-07-16 DIAGNOSIS — E7800 Pure hypercholesterolemia, unspecified: Secondary | ICD-10-CM

## 2020-07-16 DIAGNOSIS — Z1159 Encounter for screening for other viral diseases: Secondary | ICD-10-CM

## 2020-07-16 LAB — COMPREHENSIVE METABOLIC PANEL
ALT: 39 U/L (ref 0–53)
AST: 27 U/L (ref 0–37)
Albumin: 4.3 g/dL (ref 3.5–5.2)
Alkaline Phosphatase: 56 U/L (ref 39–117)
BUN: 18 mg/dL (ref 6–23)
CO2: 28 mEq/L (ref 19–32)
Calcium: 9.2 mg/dL (ref 8.4–10.5)
Chloride: 103 mEq/L (ref 96–112)
Creatinine, Ser: 1.03 mg/dL (ref 0.40–1.50)
GFR: 85.97 mL/min (ref >60.00)
Glucose, Bld: 99 mg/dL (ref 70–99)
Potassium: 4.2 mEq/L (ref 3.5–5.1)
Sodium: 140 mEq/L (ref 135–145)
Total Bilirubin: 0.9 mg/dL (ref 0.2–1.2)
Total Protein: 6.6 g/dL (ref 6.0–8.3)

## 2020-07-16 LAB — LIPID PANEL
Cholesterol: 236 mg/dL — ABNORMAL HIGH (ref 0–200)
HDL: 52 mg/dL (ref >39.00)
LDL Cholesterol: 153 mg/dL — ABNORMAL HIGH (ref 0–99)
NonHDL: 183.96
Total CHOL/HDL Ratio: 5
Triglycerides: 153 mg/dL — ABNORMAL HIGH (ref 0.0–149.0)
VLDL: 30.6 mg/dL (ref 0.0–40.0)

## 2020-07-16 LAB — CBC WITH DIFFERENTIAL/PLATELET
Basophils Absolute: 0 10*3/uL (ref 0.0–0.1)
Basophils Relative: 1 % (ref 0.0–3.0)
Eosinophils Absolute: 0.1 10*3/uL (ref 0.0–0.7)
Eosinophils Relative: 3 % (ref 0.0–5.0)
HCT: 44.3 % (ref 39.0–52.0)
Hemoglobin: 15.3 g/dL (ref 13.0–17.0)
Lymphocytes Relative: 34.2 % (ref 12.0–46.0)
Lymphs Abs: 1.1 10*3/uL (ref 0.7–4.0)
MCHC: 34.6 g/dL (ref 30.0–36.0)
MCV: 94.2 fl (ref 78.0–100.0)
Monocytes Absolute: 0.3 10*3/uL (ref 0.1–1.0)
Monocytes Relative: 9 % (ref 3.0–12.0)
Neutro Abs: 1.6 10*3/uL (ref 1.4–7.7)
Neutrophils Relative %: 52.8 % (ref 43.0–77.0)
Platelets: 147 10*3/uL — ABNORMAL LOW (ref 150.0–400.0)
RBC: 4.7 Mil/uL (ref 4.22–5.81)
RDW: 12.8 % (ref 11.5–15.5)
WBC: 3.1 10*3/uL — ABNORMAL LOW (ref 4.0–10.5)

## 2020-07-16 LAB — TSH: TSH: 2.62 u[IU]/mL (ref 0.35–4.50)

## 2020-07-16 NOTE — Patient Instructions (Signed)

## 2020-07-19 LAB — HEPATITIS C ANTIBODY
Hepatitis C Ab: NONREACTIVE
SIGNAL TO CUT-OFF: 0 (ref <1.00)

## 2020-09-06 ENCOUNTER — Other Ambulatory Visit: Payer: Self-pay | Admitting: Family Medicine

## 2020-10-15 ENCOUNTER — Ambulatory Visit: Payer: 59 | Admitting: Family Medicine

## 2020-10-22 ENCOUNTER — Other Ambulatory Visit: Payer: Self-pay

## 2020-10-22 ENCOUNTER — Ambulatory Visit: Payer: 59 | Admitting: Family Medicine

## 2020-10-22 ENCOUNTER — Encounter: Payer: Self-pay | Admitting: Family Medicine

## 2020-10-22 VITALS — BP 103/63 | HR 84 | Temp 98.0°F | Ht 71.0 in | Wt 198.0 lb

## 2020-10-22 DIAGNOSIS — G5621 Lesion of ulnar nerve, right upper limb: Secondary | ICD-10-CM | POA: Diagnosis not present

## 2020-10-22 DIAGNOSIS — R2233 Localized swelling, mass and lump, upper limb, bilateral: Secondary | ICD-10-CM | POA: Diagnosis not present

## 2020-10-22 DIAGNOSIS — G5622 Lesion of ulnar nerve, left upper limb: Secondary | ICD-10-CM

## 2020-10-22 NOTE — Progress Notes (Signed)
OFFICE VISIT  10/22/2020  CC:  Chief Complaint  Patient presents with   dupuytren's contracture    Dad had surgery recently within the last months for same condition; partal numbness in last 2 fingers on both hands for 4-5 months, lumps on palms of both hands; first noticed less than 3 weeks ago   HPI:    Patient is a 50 y.o. Caucasian male who presents for nodule on palm of each hand. He first noted little hard nodule on palm of each hand a few weeks ago.  No pain, no contracture of palm or fingers.  Says his dad has had bad dupuytren's contracture bilat for year and it progressed to the point of requiring surgery recently.  Pt also has c/o decreased sensation in the ring and pinky finger on each hand.  Denies abnormal feeling in elbows or forearms.  No hand weakness.  No skin changes.  Past Medical History:  Diagnosis Date   Abnormal weight loss 2016   CXR normal.  Blood w/u normal except mildly elevated lipase, CT abd/pelv w/contrast normal.   Borderline hyperlipidemia 2021   Depression    Family history of breast cancer    Family history of colon cancer    GERD (gastroesophageal reflux disease)    Heart murmur    History of prostatitis    Left groin pain 2012   [facility] eval was reassuring; pt chose no further w/u (with surgeon or neurologist); ibuprofen was recommended.   Nasal septal granuloma 2017   Cauterized by Dr. Erik Obey   Panic attacks    in the past   Radiculopathy of arm 2013   Neuro (Dr. Jacelyn Grip) eval; MRI C-spine did not explain the pain, NCS/EMG planned but never done.   Seasonal allergic rhinitis     Past Surgical History:  Procedure Laterality Date   COLONOSCOPY W/ POLYPECTOMY  01/20/15   3 polyps--at least one adenomatous: recall 5 yrs.  No sign of IBD.    ESOPHAGOGASTRODUODENOSCOPY  01/20/15   mild chronic gastritis.  H pylori neg.  Bx's neg.   INGUINAL HERNIA REPAIR     left    Outpatient Medications Prior to Visit  Medication Sig Dispense Refill    DULoxetine (CYMBALTA) 30 MG capsule TAKE 1 CAPSULE BY MOUTH EVERY DAY 90 capsule 1   Multiple Vitamin (MULTIVITAMIN ADULT PO) Take by mouth daily.     No facility-administered medications prior to visit.    No Known Allergies  ROS As per HPI  PE: Vitals with BMI 10/22/2020 07/16/2020 07/01/2019  Height '5\' 11"'$  '5\' 11"'$  '5\' 11"'$   Weight 198 lbs 202 lbs 193 lbs 10 oz  BMI 27.63 [id number] 0000000  Systolic [id number] 0000000  Diastolic 63 71 86  Pulse 84 74 72   Gen: Alert, well appearing.  Patient is oriented to person, place, time, and situation. AFFECT: pleasant, lucid thought and speech. Palm of each hand has a firm bee-bee sized subQ nodule at the proimal aspect of 4th metacarpal bone, +moves with finger ext/flexion. No tenderness.  No palmar or digital contracture or ROM abnormality. Tinel's sign pos for signif tingling/numbness in 4th and 5th digits bilat. Sensory testing on 4th and 5th digits on each hand shows mild dec sensation to light touch.  Strength in forearms, wrists, hands normal.  LABS:    Chemistry      Component Value Date/Time   NA 140 07/16/2020 0819   K 4.2 07/16/2020 0819   CL 103 07/16/2020 0819  CO2 28 07/16/2020 0819   BUN 18 07/16/2020 0819   CREATININE 1.03 07/16/2020 0819      Component Value Date/Time   CALCIUM 9.2 07/16/2020 0819   ALKPHOS 56 07/16/2020 0819   AST 27 07/16/2020 0819   ALT 39 07/16/2020 0819   BILITOT 0.9 07/16/2020 0819      IMPRESSION AND PLAN:  1) Palmar nodule bilat, strong FH of dupuytren contracture. Suspect pt has very early dupuytren dz. Pt is interested in referral to ortho hand specialist so I ordered this today.  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 Discussed avoiding resting elbows on desk/arms of chair. Avoid frequent excessive full flexion at the elbow.  An After Visit Summary was printed and given to the patient.  FOLLOW UP: Return if symptoms worsen or fail to improve.  Signed:  Crissie Sickles, MD            10/22/2020

## 2021-03-02 ENCOUNTER — Other Ambulatory Visit: Payer: Self-pay | Admitting: Family Medicine

## 2021-05-10 ENCOUNTER — Encounter: Payer: Self-pay | Admitting: Family Medicine

## 2021-05-31 ENCOUNTER — Encounter: Payer: Self-pay | Admitting: Family Medicine

## 2021-05-31 NOTE — Telephone Encounter (Signed)
Noted  

## 2021-05-31 NOTE — Telephone Encounter (Signed)
FYI

## 2021-08-27 ENCOUNTER — Other Ambulatory Visit: Payer: Self-pay | Admitting: Family Medicine

## 2021-09-12 ENCOUNTER — Other Ambulatory Visit: Payer: Self-pay | Admitting: Family Medicine

## 2021-11-04 ENCOUNTER — Encounter: Payer: Self-pay | Admitting: Family Medicine

## 2021-11-04 ENCOUNTER — Ambulatory Visit: Payer: 59 | Admitting: Family Medicine

## 2021-11-04 VITALS — BP 126/80 | HR 73 | Temp 98.1°F | Ht 71.0 in | Wt 193.0 lb

## 2021-11-04 DIAGNOSIS — F418 Other specified anxiety disorders: Secondary | ICD-10-CM

## 2021-11-04 DIAGNOSIS — K529 Noninfective gastroenteritis and colitis, unspecified: Secondary | ICD-10-CM | POA: Diagnosis not present

## 2021-11-04 LAB — COMPREHENSIVE METABOLIC PANEL
ALT: 14 U/L (ref 0–53)
AST: 13 U/L (ref 0–37)
Albumin: 4.2 g/dL (ref 3.5–5.2)
Alkaline Phosphatase: 67 U/L (ref 39–117)
BUN: 14 mg/dL (ref 6–23)
CO2: 28 mEq/L (ref 19–32)
Calcium: 9.1 mg/dL (ref 8.4–10.5)
Chloride: 103 mEq/L (ref 96–112)
Creatinine, Ser: 1.01 mg/dL (ref 0.40–1.50)
GFR: 87.21 mL/min (ref >60.00)
Glucose, Bld: 94 mg/dL (ref 70–99)
Potassium: 5 mEq/L (ref 3.5–5.1)
Sodium: 140 mEq/L (ref 135–145)
Total Bilirubin: 0.6 mg/dL (ref 0.2–1.2)
Total Protein: 6.5 g/dL (ref 6.0–8.3)

## 2021-11-04 LAB — CBC WITH DIFFERENTIAL/PLATELET
Basophils Absolute: 0 10*3/uL (ref 0.0–0.1)
Basophils Relative: 0.6 % (ref 0.0–3.0)
Eosinophils Absolute: 0.1 10*3/uL (ref 0.0–0.7)
Eosinophils Relative: 1.7 % (ref 0.0–5.0)
HCT: 42.9 % (ref 39.0–52.0)
Hemoglobin: 14.9 g/dL (ref 13.0–17.0)
Lymphocytes Relative: 16.5 % (ref 12.0–46.0)
Lymphs Abs: 0.9 10*3/uL (ref 0.7–4.0)
MCHC: 34.7 g/dL (ref 30.0–36.0)
MCV: 94.3 fl (ref 78.0–100.0)
Monocytes Absolute: 0.5 10*3/uL (ref 0.1–1.0)
Monocytes Relative: 9.4 % (ref 3.0–12.0)
Neutro Abs: 3.8 10*3/uL (ref 1.4–7.7)
Neutrophils Relative %: 71.8 % (ref 43.0–77.0)
Platelets: 168 10*3/uL (ref 150.0–400.0)
RBC: 4.55 Mil/uL (ref 4.22–5.81)
RDW: 12.7 % (ref 11.5–15.5)
WBC: 5.2 10*3/uL (ref 4.0–10.5)

## 2021-11-04 MED ORDER — LEVOFLOXACIN 500 MG PO TABS: 500.0000 mg | ORAL_TABLET | Freq: Every day | ORAL | 0 refills | Status: DC

## 2021-11-04 MED ORDER — LORAZEPAM 0.5 MG PO TABS: 0.5000 mg | ORAL_TABLET | Freq: Two times a day (BID) | ORAL | 1 refills | Status: AC | PRN

## 2021-11-04 NOTE — Progress Notes (Signed)
OFFICE VISIT  11/04/2021  CC:  Chief Complaint  Patient presents with   Abdominal Pain    Lower; patient states stomach crams. Returned from his trip in 8/10, took Imodium 8/13-8/14 and 8/16 cramps started and felt constipation. He took laxatives on 18-19th. Had abouts of diarrhea so he has not been eating much since then     Patient is a 51 y.o. male who presents for gastrointestinal concerns.  HPI: Onset about 15 days ago after returning from [place] He had stayed in a resort, ate lots of buffet food. Initial symptom was frequent watery stool without blood or pus. He took some antidiarrhea over-the-counter medicine and pretty soon he felt like he was stopped up.  He then took some MiraLAX and had several large and loose BMs and felt like he had evacuated his colon pretty good. Now for the last several days he has had a sense of incomplete emptying when he has a bowel movement and has significant severe lower abdomen cramping, sometimes doubled him over.  Left greater than right. His appetite is poor but has had no nausea or vomiting.  No headaches or fever, no body aches.  No skin changes.     Past Medical History:  Diagnosis Date   Abnormal weight loss 2016   CXR normal.  Blood w/u normal except mildly elevated lipase, CT abd/pelv w/contrast normal.   Borderline hyperlipidemia 2021   Depression    Dupuytren contracture    Family history of breast cancer    Family history of colon cancer    GERD (gastroesophageal reflux disease)    Heart murmur    History of prostatitis    Left groin pain 2012   [facility] eval was reassuring; pt chose no further w/u (with surgeon or neurologist); ibuprofen was recommended.   Nasal septal granuloma 2017   Cauterized by Dr. Erik Obey   Panic attacks    in the past   Radiculopathy of arm 2013   Neuro (Dr. Jacelyn Grip) eval; MRI C-spine did not explain the pain, NCS/EMG planned but never done.   Seasonal allergic rhinitis    Ulnar  neuropathy at elbow, unspecified laterality    bilat    Past Surgical History:  Procedure Laterality Date   COLONOSCOPY W/ POLYPECTOMY  01/20/15   3 polyps--at least one adenomatous: recall 5 yrs.  No sign of IBD.    ESOPHAGOGASTRODUODENOSCOPY  01/20/15   mild chronic gastritis.  H pylori neg.  Bx's neg.   INGUINAL HERNIA REPAIR     left    Outpatient Medications Prior to Visit  Medication Sig Dispense Refill   Multiple Vitamin (MULTIVITAMIN ADULT PO) Take by mouth daily.     DULoxetine (CYMBALTA) 30 MG capsule TAKE 1 CAPSULE BY MOUTH EVERY DAY 30 capsule 0   No facility-administered medications prior to visit.    No Known Allergies  ROS As per HPI  PE:    11/04/2021    8:15 AM 10/22/2020    2:47 PM 07/16/2020    8:04 AM  Vitals with BMI  Height $Remov'5\' 11"'gUMChx$  $RemoveB'5\' 11"'qHUcednm$  $RemoveBef'5\' 11"'sNwRRDRBHV$   Weight 193 lbs 198 lbs 202 lbs  BMI 26.93 02.77 41.28  Systolic 786 767 209  Diastolic 80 63 71  Pulse 73 84 74     Physical Exam  Gen: Alert, well appearing.  Patient is oriented to person, place, time, and situation. AFFECT: pleasant, lucid thought and speech. OBS:JGGE: no injection, icteris, swelling, or exudate.  EOMI, PERRLA. Mouth: lips without  lesion/swelling.  Oral mucosa pink and moist. Oropharynx without erythema, exudate, or swelling.  CV: RRR, no m/r/g.   LUNGS: CTA bilat, nonlabored resps, good aeration in all lung fields. ABD: soft, BS normal. No distention.  Mild/mod TTP in L>R lower quadrants w/out guarding or rebound. SKIN: no jaundice  LABS:  Last CBC Lab Results  Component Value Date   WBC 3.1 (L) 07/16/2020   HGB 15.3 07/16/2020   HCT 44.3 07/16/2020   MCV 94.2 07/16/2020   MCH 32.5 05/07/2013   RDW 12.8 07/16/2020   PLT 147.0 (L) 69/50/7225   Last metabolic panel Lab Results  Component Value Date   GLUCOSE 99 07/16/2020   NA 140 07/16/2020   K 4.2 07/16/2020   CL 103 07/16/2020   CO2 28 07/16/2020   BUN 18 07/16/2020   CREATININE 1.03 07/16/2020   GFRNONAA >90  05/07/2013   CALCIUM 9.2 07/16/2020   PROT 6.6 07/16/2020   ALBUMIN 4.3 07/16/2020   BILITOT 0.9 07/16/2020   ALKPHOS 56 07/16/2020   AST 27 07/16/2020   ALT 39 07/16/2020   Last hemoglobin A1c Lab Results  Component Value Date   HGBA1C 5.0 07/02/2019   Lab Results  Component Value Date   LIPASE 62.0 (H) 09/11/2014   IMPRESSION AND PLAN:  #1 gastroenteritis.  Along symptoms, severe abdominal cramps. CBC and c-Met today. Obtain stool for culture and C. difficile PCR. Empiric Levaquin 500 mg daily x7 days prescribed today.  Of note, he knows he is overdue for colonoscopy and he will arrange this soon. His father had ulcerative colitis.  #2 history of significant anxiety, episode of depression in the past. He was on duloxetine pretty long-term but in April accidentally did not take it with him on a long business trip.  He did not restart it he says he seems to be doing fine.  He thinks his mood issues in the past have been secondary to acute bad situational anxiety.  He got some benefit from using lorazepam as needed in the past and used it infrequently. We decided to restart lorazepam 0.5 mg 1-2 twice daily as needed, #30, refill x1.  An After Visit Summary was printed and given to the patient.  FOLLOW UP: Return in about 1 week (around 11/11/2021) for f/u GI.  Signed:  Crissie Sickles, MD           11/04/2021

## 2021-11-11 ENCOUNTER — Encounter: Payer: Self-pay | Admitting: Family Medicine

## 2021-11-11 ENCOUNTER — Ambulatory Visit: Payer: 59 | Admitting: Family Medicine

## 2021-11-11 VITALS — BP 121/78 | HR 62 | Temp 98.0°F | Ht 71.0 in | Wt 196.0 lb

## 2021-11-11 DIAGNOSIS — R197 Diarrhea, unspecified: Secondary | ICD-10-CM | POA: Diagnosis not present

## 2021-11-11 DIAGNOSIS — R1030 Lower abdominal pain, unspecified: Secondary | ICD-10-CM

## 2021-11-11 DIAGNOSIS — K5792 Diverticulitis of intestine, part unspecified, without perforation or abscess without bleeding: Secondary | ICD-10-CM | POA: Diagnosis not present

## 2021-11-11 DIAGNOSIS — Z8379 Family history of other diseases of the digestive system: Secondary | ICD-10-CM | POA: Diagnosis not present

## 2021-11-11 MED ORDER — METRONIDAZOLE 500 MG PO TABS: 500.0000 mg | ORAL_TABLET | Freq: Three times a day (TID) | ORAL | 0 refills | Status: AC

## 2021-11-11 MED ORDER — LEVOFLOXACIN 500 MG PO TABS: 500.0000 mg | ORAL_TABLET | Freq: Every day | ORAL | 0 refills | Status: DC

## 2021-11-11 NOTE — Progress Notes (Signed)
OFFICE VISIT  11/11/2021  CC:  Chief Complaint  Patient presents with   Abdominal Pain    Pt states sx are improving with abx     Patient is a 51 y.o. male who presents for 1 wk f/u diarrhea. A/P as of last visit: "#1 gastroenteritis.  Long duration of symptoms, severe abdominal cramps. CBC and c-Met today. Obtain stool for culture and C. difficile PCR. Empiric Levaquin 500 mg daily x7 days prescribed today.   Of note, he knows he is overdue for colonoscopy and he will arrange this soon. His father had ulcerative colitis.   #2 history of significant anxiety, episode of depression in the past. He was on duloxetine pretty long-term but in April accidentally did not take it with him on a long business trip.  He did not restart it he says he seems to be doing fine.  He thinks his mood issues in the past have been secondary to acute bad situational anxiety.  He got some benefit from using lorazepam as needed in the past and used it infrequently. We decided to restart lorazepam 0.5 mg 1-2 twice daily as needed, #30, refill x1."  INTERIM HX: He is much better.  A day or 2 after he started the antibiotic his lower abdominal pain began to resolve.  He just has a mild amount of discomfort in the left lower abdomen such that he can feel there is something still going on.  He has multiple loose stools per day still.  No fever. He turned a stool sample and this morning for evaluation.  Past Medical History:  Diagnosis Date   Abnormal weight loss 2016   CXR normal.  Blood w/u normal except mildly elevated lipase, CT abd/pelv w/contrast normal.   Borderline hyperlipidemia 2021   Depression    Dupuytren contracture    Family history of breast cancer    Family history of colon cancer    GERD (gastroesophageal reflux disease)    Heart murmur    History of prostatitis    Left groin pain 2012   [facility] eval was reassuring; pt chose no further w/u (with surgeon or neurologist); ibuprofen was  recommended.   Nasal septal granuloma 2017   Cauterized by Dr. Erik Obey   Panic attacks    in the past   Radiculopathy of arm 2013   Neuro (Dr. Jacelyn Grip) eval; MRI C-spine did not explain the pain, NCS/EMG planned but never done.   Seasonal allergic rhinitis    Ulnar neuropathy at elbow, unspecified laterality    bilat    Past Surgical History:  Procedure Laterality Date   COLONOSCOPY W/ POLYPECTOMY  01/20/15   3 polyps--at least one adenomatous: recall 5 yrs.  No sign of IBD.    ESOPHAGOGASTRODUODENOSCOPY  01/20/15   mild chronic gastritis.  H pylori neg.  Bx's neg.   INGUINAL HERNIA REPAIR     left    Outpatient Medications Prior to Visit  Medication Sig Dispense Refill   LORazepam (ATIVAN) 0.5 MG tablet Take 1 tablet (0.5 mg total) by mouth 2 (two) times daily as needed for anxiety. 30 tablet 1   Multiple Vitamin (MULTIVITAMIN ADULT PO) Take by mouth daily.     levofloxacin (LEVAQUIN) 500 MG tablet Take 1 tablet (500 mg total) by mouth daily. (Patient not taking: Reported on 11/11/2021) 7 tablet 0   No facility-administered medications prior to visit.    No Known Allergies  ROS As per HPI  PE:    11/11/2021  2:34 PM 11/04/2021    8:15 AM 10/22/2020    2:47 PM  Vitals with BMI  Height $Remov'5\' 11"'JAeGgC$  $RemoveB'5\' 11"'owDqTNkl$  $RemoveBef'5\' 11"'uGMIelRNZe$   Weight 196 lbs 193 lbs 198 lbs  BMI 27.35 68.37 29.02  Systolic 111 552 080  Diastolic 78 80 63  Pulse 62 73 84     Physical Exam  Gen: Alert, well appearing.  Patient is oriented to person, place, time, and situation.. AFFECT: pleasant, lucid thought and speech. No further exam today.   LABS:  Last CBC Lab Results  Component Value Date   WBC 5.2 11/04/2021   HGB 14.9 11/04/2021   HCT 42.9 11/04/2021   MCV 94.3 11/04/2021   MCH 32.5 05/07/2013   RDW 12.7 11/04/2021   PLT 168.0 22/33/6122   Last metabolic panel Lab Results  Component Value Date   GLUCOSE 94 11/04/2021   NA 140 11/04/2021   K 5.0 11/04/2021   CL 103 11/04/2021   CO2 28 11/04/2021    BUN 14 11/04/2021   CREATININE 1.01 11/04/2021   GFRNONAA >90 05/07/2013   CALCIUM 9.1 11/04/2021   PROT 6.5 11/04/2021   ALBUMIN 4.2 11/04/2021   BILITOT 0.6 11/04/2021   ALKPHOS 67 11/04/2021   AST 13 11/04/2021   ALT 14 11/04/2021   Lab Results  Component Value Date   ESRSEDRATE 6 07/31/2014   IMPRESSION AND PLAN:  Abdominal pain with diarrhea.  Improved significantly with 1 week of Levaquin. He turned in a stool sample for evaluation today. Symptoms were most consistent with acute gastroenteritis but acute diverticulitis is also a possible cause. We will give 3 more days of Levaquin 500 mg and will prescribe metronidazole 500 mg 3 times daily x10 days. Will refer to gastroenterology.  He has family history (father) of ulcerative colitis. His last colonoscopy was in 2016.  An After Visit Summary was printed and given to the patient.  FOLLOW UP: Return if symptoms worsen or fail to improve.  Signed:  Crissie Sickles, MD           11/11/2021

## 2021-11-15 LAB — STOOL CULTURE: E coli, Shiga toxin Assay: NEGATIVE

## 2021-11-15 LAB — CLOSTRIDIUM DIFFICILE TOXIN B, QUALITATIVE, REAL-TIME PCR: Toxigenic C. Difficile by PCR: NOT DETECTED

## 2021-12-16 ENCOUNTER — Ambulatory Visit: Payer: 59 | Admitting: Nurse Practitioner

## 2021-12-16 ENCOUNTER — Encounter: Payer: Self-pay | Admitting: Nurse Practitioner

## 2021-12-16 VITALS — BP 110/72 | HR 70 | Ht 71.0 in | Wt 191.6 lb

## 2021-12-16 DIAGNOSIS — Z86010 Personal history of colonic polyps: Secondary | ICD-10-CM

## 2021-12-16 DIAGNOSIS — R1032 Left lower quadrant pain: Secondary | ICD-10-CM | POA: Diagnosis not present

## 2021-12-16 MED ORDER — NA SULFATE-K SULFATE-MG SULF 17.5-3.13-1.6 GM/177ML PO SOLN: 1.0000 | Freq: Once | ORAL | 0 refills | Status: AC

## 2021-12-16 NOTE — Progress Notes (Signed)
12/16/2021 Anthony Gilmore [id number] 1971/01/10   Chief Complaint: Schedule a colonoscopy, LLQ pain   History of Present Illness: History of anxiety, depression, GERD, and colon polyps. He presents to our office today as referred by Dr. Ricardo Jericho for further evaluation regarding diarrhea, abdominal pain and to schedule a colonoscopy. He traveled to the [place] 03/5174 and a few days after he returned home he developed nonbloody diarrhea for about one week days followed by feeling constipated and subsequently developed LLQ pain.  Approximately 10 days later, he was seen by his Dr. Anitra Lauth 11/04/2021 who ordered stool culture and C. difficile tests which were negative.  He was prescribed Levaquin '500mg'$  po QD for 1 week for suspected diverticulitis.  His diarrhea improved but he continued to have LLQ pain therefore Dr. Anitra Lauth prescribed Metronidazole  '500mg'$  tid for 10 days and Levaquin '500mg'$  for 3 additional days.  His severe abdominal pain abated within one week followed by less intense intermittent LLQ tenderness which typically occurred after passing a bowel movement.  He denies having any further LLQ pain for the past few weeks.  No rectal bleeding.  He noted passing black diarrhea stools after he took Pepto-Bismol.  No further black stools as he remains off Pepto-Bismol.  He is passing a normal formed brown bowel movement once daily at this point.  He underwent a colonoscopy by Dr. Hilarie Fredrickson 01/20/2015 which identified 3 polyps removed from the ascending and sigmoid colon.  He was advised to repeat a colonoscopy in 5 years.  His mother was diagnosed with colon cancer in her 61s.  He underwent an EGD at the time of his colonoscopy which showed mild chronic gastritis. He denies having any dysphagia or heartburn symptoms.      Latest Ref Rng & Units 11/04/2021    8:51 AM 07/16/2020    8:19 AM 07/01/2019    8:19 AM  CBC  WBC 4.0 - 10.5 K/uL 5.2  3.1  3.4   Hemoglobin 13.0 - 17.0 g/dL  14.9  15.3  15.3   Hematocrit 39.0 - 52.0 % 42.9  44.3  44.3   Platelets 150.0 - 400.0 K/uL 168.0  147.0  149.0        Latest Ref Rng & Units 11/04/2021    8:51 AM 07/16/2020    8:19 AM 07/01/2019    8:19 AM  CMP  Glucose 70 - 99 mg/dL 94  99  109   BUN 6 - 23 mg/dL '14  18  14   '$ Creatinine 0.40 - 1.50 mg/dL 1.01  1.03  0.90   Sodium 135 - 145 mEq/L 140  140  139   Potassium 3.5 - 5.1 mEq/L 5.0  4.2  4.9   Chloride 96 - 112 mEq/L 103  103  103   CO2 19 - 32 mEq/L '28  28  28   '$ Calcium 8.4 - 10.5 mg/dL 9.1  9.2  9.4   Total Protein 6.0 - 8.3 g/dL 6.5  6.6  6.5   Total Bilirubin 0.2 - 1.2 mg/dL 0.6  0.9  1.0   Alkaline Phos 39 - 117 U/L 67  56  54   AST 0 - 37 U/L '13  27  19   '$ ALT 0 - 53 U/L 14  39  16     Colonoscopy 01/20/2015 by Dr. Hilarie Fredrickson: 1. The examined terminal ileum appeared to be normal 2. Three sessile polyps ranging between 3-52m in size were found in the ascending colon and  sigmoid colon; polypectomies were performed with a cold snare 3. Mild diverticulosis was noted in the ascending colon and sigmoid colon 4. No evidence of inflammatory bowel disease or malignancy 5.  Recall colonoscopy 5 years.  EGD 01/20/2015: 1. The mucosa of the esophagus appeared normal 2. Gastritis (inflammation) was found in the gastric antrum; multiple biopsies 3. The duodenal mucosa showed no abnormalities in the bulb and 2nd part of the duodenum  1. Surgical [P], gastric body - MILD CHRONIC GASTRITIS. -A WARTHIN-STARRY STAIN IS NEGATIVE FOR HELICOBACTER PYLORI. -NEGATIVE FOR INTESTINAL METAPLASIA OR MALIGNANCY. 2. Surgical [P], sigmoid and ascending, polyp (3) -TUBULAR ADENOMA AND HYPERPLASTIC COLONIC POLYPS. -NO HIGH GRADE DYSPLASIA OR MALIGNANCY IDENTIFIED.  Past Medical History:  Diagnosis Date   Abnormal weight loss 2016   CXR normal.  Blood w/u normal except mildly elevated lipase, CT abd/pelv w/contrast normal.   Borderline hyperlipidemia 2021   Depression    Dupuytren  contracture    Family history of breast cancer    Family history of colon cancer    GERD (gastroesophageal reflux disease)    Heart murmur    History of prostatitis    Left groin pain 2012   [facility] eval was reassuring; pt chose no further w/u (with surgeon or neurologist); ibuprofen was recommended.   Nasal septal granuloma 2017   Cauterized by Dr. Erik Obey   Panic attacks    in the past   Radiculopathy of arm 2013   Neuro (Dr. Jacelyn Grip) eval; MRI C-spine did not explain the pain, NCS/EMG planned but never done.   Seasonal allergic rhinitis    Ulnar neuropathy at elbow, unspecified laterality    bilat   Past Surgical History:  Procedure Laterality Date   COLONOSCOPY W/ POLYPECTOMY  01/20/15   3 polyps--at least one adenomatous: recall 5 yrs.  No sign of IBD.    ESOPHAGOGASTRODUODENOSCOPY  01/20/15   mild chronic gastritis.  H pylori neg.  Bx's neg.   INGUINAL HERNIA REPAIR     left   Family history: Mother was diagnosed with colon cancer in her 64s, she also had breast and lung cancer.  Sister had breast cancer.  Father with history of ulcerative colitis.  Social history: He is originally from [place]. Smoked cigarettes in the past. He quit smoking 20 yeas ago, vaped for a while but stopped 1 yr ago. He drinks  2 to 3 beers or glasses of wine 5 nights weekly. No history of drug use.   Review of Systems:   Constitutional: Negative for fever, sweats, chills or weight loss.  Respiratory: Negative for shortness of breath.   Cardiovascular: Negative for chest pain, palpitations and leg swelling.  Gastrointestinal: See HPI.  Musculoskeletal: Negative for back pain or muscle aches.  Neurological: Negative for dizziness, headaches or paresthesias.   Physical Exam: BP 110/72   Pulse 70   Ht '5\' 11"'$  (1.803 m)   Wt 191 lb 9.6 oz (86.9 kg)   SpO2 98%   BMI 26.72 kg/m  General: 51 year old male in NAD. Head: Normocephalic and atraumatic. Eyes: No scleral icterus. Conjunctiva pink  . Ears: Normal auditory acuity. Mouth: Dentition intact. No ulcers or lesions.  Lungs: Clear throughout to auscultation. Heart: Regular rate and rhythm, no murmur. Abdomen: Soft, nontender and nondistended. No masses or hepatomegaly. Normal bowel sounds x 4 quadrants.  Rectal: Deferred.  Musculoskeletal: Symmetrical with no gross deformities. Extremities: No edema. Neurological: Alert oriented x 4. No focal deficits.  Psychological: Alert and cooperative. Normal  mood and affect  Assessment and Recommendations:  27) 51 year old male with diarrhea followed by constipation and LLQ pain which started after he returned home after traveling to the [place] 10/6771.  Stool culture  and C. difficile tests were negative.  He received a course of Levaquin/Flagyl and his diarrhea resolved within a few days and his abdominal pain gradually improved with minor residual LLQ discomfort which occurs when passing a BM.  I suspect he initially had infectious diarrhea and possible diverticulitis after he became constipated.  Abdominal exam today without abdominal tenderness.  Colonoscopy 01/2015 showed diverticulosis to the ascending and sigmoid colon, no evidence of IBD.  Father with history of ulcerative colitis. -Patient to contact our office if diarrhea or abdominal pain recurs, recommend CTAP if abdominal pain recurs -Push fluids, diet as tolerated -Colonoscopy benefits and risks discussed including risk with sedation, risk of bleeding, perforation and infection  -MiraLAX as needed if constipation recurs  2) History of 3 tubular adenomatous move from the colon per colonoscopy 01/20/2015 -Colonoscopy benefits and risks discussed including risk with sedation, risk of bleeding, perforation and infection   Further recommendations to be determined after colonoscopy completed

## 2021-12-16 NOTE — Patient Instructions (Addendum)
_______________________________________________________  If you are age 50 or older, your body mass index should be between 23-30. Your Body mass index is 26.72 kg/m. If this is out of the aforementioned range listed, please consider follow up with your Primary Care Provider.  If you are age 12 or younger, your body mass index should be between 19-25. Your Body mass index is 26.72 kg/m. If this is out of the aformentioned range listed, please consider follow up with your Primary Care Provider.   ________________________________________________________  The [facility] providers would like to encourage you to use [facility] to communicate with providers for non-urgent requests or questions.  Due to long hold times on the telephone, sending your provider a message by [facility] may be a faster and more efficient way to get a response.  Please allow 48 business hours for a response.  Please remember that this is for non-urgent requests.  _______________________________________________________   Take Miralax 1 capful mixed in 8 ounces of water at bed time for constipation as tolerated.  Contact our office if your lower abdominal pain recurs prior to your colonoscopy date   Due to recent changes in healthcare laws, you may see the results of your imaging and laboratory studies on MyChart before your provider has had a chance to review them.  We understand that in some cases there may be results that are confusing or concerning to you. Not all laboratory results come back in the same time frame and the provider may be waiting for multiple results in order to interpret others.  Please give [place] 48 hours in order for your provider to thoroughly review all the results before contacting the office for clarification of your results.   It was a pleasure to see you today!  Thank you for trusting me with your gastrointestinal care!

## 2021-12-16 NOTE — Progress Notes (Signed)
Addendum: Reviewed and agree with assessment and management plan. Kamsiyochukwu Spickler M, MD  

## 2022-01-31 ENCOUNTER — Encounter: Payer: Self-pay | Admitting: Internal Medicine

## 2022-02-09 ENCOUNTER — Encounter: Payer: 59 | Admitting: Internal Medicine

## 2022-03-31 ENCOUNTER — Telehealth: Payer: Self-pay | Admitting: *Deleted

## 2022-03-31 ENCOUNTER — Ambulatory Visit: Payer: 59

## 2022-03-31 NOTE — Telephone Encounter (Signed)
2nd attempt to reach patient. LM with call back # and informed patient if not heard from before 5pm todayprocedure will be canceled.

## 2022-03-31 NOTE — Telephone Encounter (Signed)
1st attempt to reach patient. LM with call back #. Informed patient I would try back.

## 2022-03-31 NOTE — Progress Notes (Unsigned)
No egg or soy allergy known to patient  No issues known to pt with past sedation with any surgeries or procedures Patient denies ever being told they had issues or difficulty with intubation  No FH of Malignant Hyperthermia Pt is not on diet pills Pt is not on  home 02  Pt is not on blood thinners  Pt denies issues with constipation  Pt is not on dialysis Pt denies any upcoming cardiac testing Pt encouraged to use to use Singlecare or Goodrx to reduce cost  Patient's chart reviewed by Osvaldo Angst CNRA prior to previsit and patient appropriate for the [place].  Previsit completed and red dot placed by patient's name on their procedure day (on provider's schedule).  . Vsiit by phone Instructions sent by mail with coupon and [facility] chart

## 2022-04-19 ENCOUNTER — Encounter: Payer: 59 | Admitting: Internal Medicine

## 2023-10-19 ENCOUNTER — Ambulatory Visit (INDEPENDENT_AMBULATORY_CARE_PROVIDER_SITE_OTHER): Admitting: Family Medicine

## 2023-10-19 ENCOUNTER — Encounter: Payer: Self-pay | Admitting: Family Medicine

## 2023-10-19 VITALS — BP 97/61 | HR 82 | Temp 98.6°F | Ht 72.0 in | Wt 179.8 lb

## 2023-10-19 DIAGNOSIS — Z23 Encounter for immunization: Secondary | ICD-10-CM | POA: Diagnosis not present

## 2023-10-19 DIAGNOSIS — Z125 Encounter for screening for malignant neoplasm of prostate: Secondary | ICD-10-CM | POA: Diagnosis not present

## 2023-10-19 DIAGNOSIS — Z0000 Encounter for general adult medical examination without abnormal findings: Secondary | ICD-10-CM

## 2023-10-19 NOTE — Progress Notes (Signed)
 Office Note 10/19/2023  CC: No chief complaint on file. He last ate 4 hrs ago.  HPI:  Patient is a 53 y.o. male who is here for annual health maintenance exam. Steel has been doing very well.  He has made some great changes in his diet and exercise habits and has been meditating and feels like he is doing great.  He needs a physical exam and wanted to get updated on vaccines and labs.   Past Medical History:  Diagnosis Date   Abnormal weight loss 2016   CXR normal.  Blood w/u normal except mildly elevated lipase, CT abd/pelv w/contrast normal.   Borderline hyperlipidemia 2021   Depression    Dupuytren contracture    Family history of breast cancer    Family history of colon cancer    GERD (gastroesophageal reflux disease)    Heart murmur    History of prostatitis    Left groin pain 2012   [facility] eval was reassuring; pt chose no further w/u (with surgeon or neurologist); ibuprofen was recommended.   Nasal septal granuloma 2017   Cauterized by Dr. Arlana   Panic attacks    in the past   Radiculopathy of arm 2013   Neuro (Dr. Cyrena) eval; MRI C-spine did not explain the pain, NCS/EMG planned but never done.   Seasonal allergic rhinitis    Ulnar neuropathy at elbow, unspecified laterality    bilat    Past Surgical History:  Procedure Laterality Date   COLONOSCOPY W/ POLYPECTOMY  01/20/15   3 polyps--at least one adenomatous: recall 5 yrs.  No sign of IBD.    ESOPHAGOGASTRODUODENOSCOPY  01/20/15   mild chronic gastritis.  H pylori neg.  Bx's neg.   INGUINAL HERNIA REPAIR     left    Family History  Problem Relation Age of Onset   Breast cancer Mother    Colon cancer Mother    Lung cancer Mother    Breast cancer Sister 36   Ulcerative colitis Father     Social History   Socioeconomic History   Marital status: Married    Spouse name: Not on file   Number of children: 2   Years of education: Not on file   Highest education level: Bachelor's degree (e.g.,  BA, AB, BS)  Occupational History   Not on file  Tobacco Use   Smoking status: Former    Current packs/day: 1.50    Average packs/day: 1.5 packs/day for 11.0 years (16.5 ttl pk-yrs)    Types: Cigarettes, E-cigarettes   Smokeless tobacco: Never   Tobacco comments:    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   [place] [facility] about 2011.   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   Likes soccer, brews beer, music.   Sporadic exercise: wt lifting, running, squash.   Tob 20 pack-yr hx; quit around age 35.     Alcohol: couple beers several days a week.   No drug use/abuse.         Social Drivers of Health   Financial Resource Strain: Low Risk  (10/15/2023)  Overall Financial Resource Strain (CARDIA)    Difficulty of Paying Living Expenses: Not hard at all  Food Insecurity: No Food Insecurity (10/15/2023)   Hunger Vital Sign    Worried About Running Out of Food in the Last Year: Never true    Ran Out of Food in the Last Year: Never true  Transportation Needs: No Transportation Needs (10/15/2023)   PRAPARE - Administrator, Civil Service (Medical): No    Lack of Transportation (Non-Medical): No  Physical Activity: Insufficiently Active (10/15/2023)   Exercise Vital Sign    Days of Exercise per Week: 3 days    Minutes of Exercise per Session: 30 min  Stress: No Stress Concern Present (10/15/2023)   [employer] of Occupational Health - Occupational Stress Questionnaire    Feeling of Stress: Not at all  Social Connections: Socially Isolated (10/15/2023)   Social Connection and Isolation Panel    Frequency of Communication with Friends and Family: Once a  week    Frequency of Social Gatherings with Friends and Family: Once a week    Attends Religious Services: Never    Database administrator or Organizations: No    Attends Engineer, structural: Not on file    Marital Status: Married  Catering manager Violence: Not on file    Outpatient Medications Prior to Visit  Medication Sig Dispense Refill   LORazepam  (ATIVAN ) 0.5 MG tablet Take 1 tablet (0.5 mg total) by mouth 2 (two) times daily as needed for anxiety. 30 tablet 1   Multiple Vitamin (MULTIVITAMIN ADULT PO) Take by mouth daily.     levofloxacin  (LEVAQUIN ) 500 MG tablet Take 1 tablet (500 mg total) by mouth daily. (Patient not taking: Reported on 12/16/2021) 3 tablet 0   No facility-administered medications prior to visit.    No Known Allergies  Review of Systems  Constitutional:  Negative for appetite change, chills, fatigue and fever.  HENT:  Negative for congestion, dental problem, ear pain and sore throat.   Eyes:  Negative for discharge, redness and visual disturbance.  Respiratory:  Negative for cough, chest tightness, shortness of breath and wheezing.   Cardiovascular:  Negative for chest pain, palpitations and leg swelling.  Gastrointestinal:  Negative for abdominal pain, blood in stool, diarrhea, nausea and vomiting.  Genitourinary:  Negative for difficulty urinating, dysuria, flank pain, frequency, hematuria and urgency.  Musculoskeletal:  Negative for arthralgias, back pain, joint swelling, myalgias and neck stiffness.  Skin:  Negative for pallor and rash.  Neurological:  Negative for dizziness, speech difficulty, weakness and headaches.  Hematological:  Negative for adenopathy. Does not bruise/bleed easily.  Psychiatric/Behavioral:  Negative for confusion and sleep disturbance. The patient is not nervous/anxious.    PE;    10/19/2023    2:59 PM 12/16/2021    9:54 AM 11/11/2021    2:34 PM  Vitals with BMI  Height 6' 0 5' 11 5' 11  Weight 179 lbs 13 oz 191  lbs 10 oz 196 lbs  BMI 24.38 26.73 27.35  Systolic 97 110 121  Diastolic 61 72 78  Pulse 82 70 62     Gen: Alert, well appearing.  Patient is oriented to person, place, time, and situation. AFFECT: pleasant, lucid thought and speech. ENT: Ears: EACs clear, normal epithelium.  TMs with good light reflex and landmarks bilaterally.  Eyes: no injection, icteris, swelling, or exudate.  EOMI, PERRLA. Nose: no drainage or turbinate edema/swelling.  No injection or focal lesion.  Mouth: lips  without lesion/swelling.  Oral mucosa pink and moist.  Dentition intact and without obvious caries or gingival swelling.  Oropharynx without erythema, exudate, or swelling.  Neck: supple/nontender.  No LAD, mass, or TM.  Carotid pulses 2+ bilaterally, without bruits. CV: RRR, no m/r/g.   LUNGS: CTA bilat, nonlabored resps, good aeration in all lung fields. ABD: soft, NT, ND, BS normal.  No hepatospenomegaly or mass.  No bruits. EXT: no clubbing, cyanosis, or edema.  Left hand index finger with moderate contraction deformity and puckering at the MCP joint. Musculoskeletal: no joint swelling, erythema, warmth, or tenderness.  ROM of all joints intact. Skin - no sores or suspicious lesions or rashes or color changes  Pertinent labs:  Lab Results  Component Value Date   TSH 2.62 07/16/2020   Lab Results  Component Value Date   WBC 5.2 11/04/2021   HGB 14.9 11/04/2021   HCT 42.9 11/04/2021   MCV 94.3 11/04/2021   PLT 168.0 11/04/2021   Lab Results  Component Value Date   CREATININE 1.01 11/04/2021   BUN 14 11/04/2021   NA 140 11/04/2021   K 5.0 11/04/2021   CL 103 11/04/2021   CO2 28 11/04/2021   Lab Results  Component Value Date   ALT 14 11/04/2021   AST 13 11/04/2021   ALKPHOS 67 11/04/2021   BILITOT 0.6 11/04/2021   Lab Results  Component Value Date   CHOL 236 (H) 07/16/2020   Lab Results  Component Value Date   HDL 52.00 07/16/2020   Lab Results  Component Value Date   LDLCALC  153 (H) 07/16/2020   Lab Results  Component Value Date   TRIG 153.0 (H) 07/16/2020   Lab Results  Component Value Date   CHOLHDL 5 07/16/2020   Lab Results  Component Value Date   PSA 0.71 07/31/2014   Lab Results  Component Value Date   HGBA1C 5.0 07/02/2019   Lab Results  Component Value Date   LIPASE 62.0 (H) 09/11/2014   ASSESSMENT AND PLAN:   Health maintenance exam: Reviewed age and gender appropriate health maintenance issues (prudent diet, regular exercise, health risks of tobacco and excessive alcohol, use of seatbelts, fire alarms in home, use of sunscreen).  Also reviewed age and gender appropriate health screening as well as vaccine recommendations. Vaccines: Shingrix  No. 2 today.  Prevnar 20 today. Labs: Health panel and PSA ordered Prostate ca screening: PSA ordered Colon ca screening: Adenoma on colonoscopy 2016.  Recall recommended 5 years, overdue-->gave pt contact info for [facility] so he can arrange this.  An After Visit Summary was printed and given to the patient.  FOLLOW UP:  Return in about 1 year (around 10/18/2024) for annual CPE (fasting).  Signed:  Gerlene Hockey, MD           10/19/2023

## 2023-10-19 NOTE — Patient Instructions (Addendum)
 Call [facility] to arrange your colonoscopy:  [phone].  Health Maintenance, Male Adopting a healthy lifestyle and getting preventive care are important in promoting health and wellness. Ask your health care provider about: The right schedule for you to have regular tests and exams. Things you can do on your own to prevent diseases and keep yourself healthy. What should I know about diet, weight, and exercise? Eat a healthy diet  Eat a diet that includes plenty of vegetables, fruits, low-fat dairy products, and lean protein. Do not eat a lot of foods that are high in solid fats, added sugars, or sodium. Maintain a healthy weight Body mass index (BMI) is a measurement that can be used to identify possible weight problems. It estimates body fat based on height and weight. Your health care provider can help determine your BMI and help you achieve or maintain a healthy weight. Get regular exercise Get regular exercise. This is one of the most important things you can do for your health. Most adults should: Exercise for at least 150 minutes each week. The exercise should increase your heart rate and make you sweat (moderate-intensity exercise). Do strengthening exercises at least twice a week. This is in addition to the moderate-intensity exercise. Spend less time sitting. Even light physical activity can be beneficial. Watch cholesterol and blood lipids Have your blood tested for lipids and cholesterol at 52 years of age, then have this test every 5 years. You may need to have your cholesterol levels checked more often if: Your lipid or cholesterol levels are high. You are older than 52 years of age. You are at high risk for heart disease. What should I know about cancer screening? Many types of cancers can be detected early and may often be prevented. Depending on your health history and family history, you may need to have cancer screening at various ages. This may include  screening for: Colorectal cancer. Prostate cancer. Skin cancer. Lung cancer. What should I know about heart disease, diabetes, and high blood pressure? Blood pressure and heart disease High blood pressure causes heart disease and increases the risk of stroke. This is more likely to develop in people who have high blood pressure readings or are overweight. Talk with your health care provider about your target blood pressure readings. Have your blood pressure checked: Every 3-5 years if you are 19-79 years of age. Every year if you are 30 years old or older. If you are between the ages of 39 and 38 and are a current or former smoker, ask your health care provider if you should have a one-time screening for abdominal aortic aneurysm (AAA). Diabetes Have regular diabetes screenings. This checks your fasting blood sugar level. Have the screening done: Once every three years after age 64 if you are at a normal weight and have a low risk for diabetes. More often and at a younger age if you are overweight or have a high risk for diabetes. What should I know about preventing infection? Hepatitis B If you have a higher risk for hepatitis B, you should be screened for this virus. Talk with your health care provider to find out if you are at risk for hepatitis B infection. Hepatitis C Blood testing is recommended for: Everyone born from [birth year] through 1964. Anyone with known risk factors for hepatitis C. Sexually transmitted infections (STIs) You should be screened each year for STIs, including gonorrhea and chlamydia, if: You are sexually active and are younger than 52 years of age.  You are older than 52 years of age and your health care provider tells you that you are at risk for this type of infection. Your sexual activity has changed since you were last screened, and you are at increased risk for chlamydia or gonorrhea. Ask your health care provider if you are at risk. Ask your health care  provider about whether you are at high risk for HIV. Your health care provider may recommend a prescription medicine to help prevent HIV infection. If you choose to take medicine to prevent HIV, you should first get tested for HIV. You should then be tested every 3 months for as long as you are taking the medicine. Follow these instructions at home: Alcohol use Do not drink alcohol if your health care provider tells you not to drink. If you drink alcohol: Limit how much you have to 0-2 drinks a day. Know how much alcohol is in your drink. In the [place] one drink equals one 12 oz bottle of beer (355 mL), one 5 oz glass of wine (148 mL), or one 1 oz glass of hard liquor (44 mL). Lifestyle Do not use any products that contain nicotine or tobacco. These products include cigarettes, chewing tobacco, and vaping devices, such as e-cigarettes. If you need help quitting, ask your health care provider. Do not use street drugs. Do not share needles. Ask your health care provider for help if you need support or information about quitting drugs. General instructions Schedule regular health, dental, and eye exams. Stay current with your vaccines. Tell your health care provider if: You often feel depressed. You have ever been abused or do not feel safe at home. Summary Adopting a healthy lifestyle and getting preventive care are important in promoting health and wellness. Follow your health care provider's instructions about healthy diet, exercising, and getting tested or screened for diseases. Follow your health care provider's instructions on monitoring your cholesterol and blood pressure. This information is not intended to replace advice given to you by your health care provider. Make sure you discuss any questions you have with your health care provider. Document Revised: 07/19/2020 Document Reviewed: 07/19/2020 Elsevier Patient Education  2024 [employer].

## 2023-10-20 LAB — CBC WITH DIFFERENTIAL/PLATELET
Absolute Lymphocytes: 1219 {cells}/uL (ref 850–3900)
Absolute Monocytes: 418 {cells}/uL (ref 200–950)
Basophils Absolute: 31 {cells}/uL (ref 0–200)
Basophils Relative: 0.6 %
Eosinophils Absolute: 82 {cells}/uL (ref 15–500)
Eosinophils Relative: 1.6 %
HCT: 45.3 % (ref 38.5–50.0)
Hemoglobin: 15.2 g/dL (ref 13.2–17.1)
MCH: 32.8 pg (ref 27.0–33.0)
MCHC: 33.6 g/dL (ref 32.0–36.0)
MCV: 97.6 fL (ref 80.0–100.0)
MPV: 10 fL (ref 7.5–12.5)
Monocytes Relative: 8.2 %
Neutro Abs: 3351 {cells}/uL (ref 1500–7800)
Neutrophils Relative %: 65.7 %
Platelets: 151 Thousand/uL (ref 140–400)
RBC: 4.64 Million/uL (ref 4.20–5.80)
RDW: 12.9 % (ref 11.0–15.0)
Total Lymphocyte: 23.9 %
WBC: 5.1 Thousand/uL (ref 3.8–10.8)

## 2023-10-20 LAB — COMPREHENSIVE METABOLIC PANEL WITH GFR
AG Ratio: 2.1 (calc) (ref 1.0–2.5)
ALT: 24 U/L (ref 9–46)
AST: 21 U/L (ref 10–35)
Albumin: 4.6 g/dL (ref 3.6–5.1)
Alkaline phosphatase (APISO): 54 U/L (ref 35–144)
BUN: 16 mg/dL (ref 7–25)
CO2: 30 mmol/L (ref 20–32)
Calcium: 9.6 mg/dL (ref 8.6–10.3)
Chloride: 106 mmol/L (ref 98–110)
Creat: 0.88 mg/dL (ref 0.70–1.30)
Globulin: 2.2 g/dL (ref 1.9–3.7)
Glucose, Bld: 87 mg/dL (ref 65–99)
Potassium: 4.9 mmol/L (ref 3.5–5.3)
Sodium: 142 mmol/L (ref 135–146)
Total Bilirubin: 0.6 mg/dL (ref 0.2–1.2)
Total Protein: 6.8 g/dL (ref 6.1–8.1)
eGFR: 104 mL/min/1.73m2 (ref >=60)

## 2023-10-20 LAB — LIPID PANEL
Cholesterol: 195 mg/dL (ref <200)
HDL: 53 mg/dL (ref >=40)
LDL Cholesterol (Calc): 120 mg/dL — ABNORMAL HIGH
Non-HDL Cholesterol (Calc): 142 mg/dL — ABNORMAL HIGH (ref <130)
Total CHOL/HDL Ratio: 3.7 (calc) (ref <5.0)
Triglycerides: 112 mg/dL (ref <150)

## 2023-10-20 LAB — PSA: PSA: 0.78 ng/mL (ref <=4.00)

## 2023-10-20 LAB — TSH: TSH: 0.78 m[IU]/L (ref 0.40–4.50)

## 2023-10-21 ENCOUNTER — Ambulatory Visit: Payer: Self-pay | Admitting: Family Medicine

## 2024-02-01 ENCOUNTER — Ambulatory Visit (INDEPENDENT_AMBULATORY_CARE_PROVIDER_SITE_OTHER)

## 2024-02-01 ENCOUNTER — Ambulatory Visit: Admitting: Family Medicine

## 2024-02-01 DIAGNOSIS — Z23 Encounter for immunization: Secondary | ICD-10-CM

## 2024-02-08 ENCOUNTER — Ambulatory Visit
